# Patient Record
Sex: Female | Born: 1980 | Race: White | Hispanic: No | Marital: Married | Smoking: Current every day smoker
Health system: Southern US, Community
[De-identification: ages and names within clinical notes are randomized; demographics above are authoritative.]

## PROBLEM LIST (undated history)

## (undated) DIAGNOSIS — D649 Anemia, unspecified: Secondary | ICD-10-CM

## (undated) DIAGNOSIS — C801 Malignant (primary) neoplasm, unspecified: Secondary | ICD-10-CM

## (undated) DIAGNOSIS — K589 Irritable bowel syndrome without diarrhea: Secondary | ICD-10-CM

## (undated) HISTORY — PX: BREAST EXCISIONAL BIOPSY: SUR124

## (undated) HISTORY — PX: WISDOM TOOTH EXTRACTION: SHX21

## (undated) HISTORY — PX: BREAST SURGERY: SHX581

## (undated) HISTORY — PX: NOSE SURGERY: SHX723

## (undated) HISTORY — PX: TUBAL LIGATION: SHX77

## (undated) HISTORY — PX: CHOLECYSTECTOMY: SHX55

## (undated) HISTORY — PX: NASAL FRACTURE SURGERY: SHX718

---
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
## 1999-06-05 ENCOUNTER — Encounter: Payer: Self-pay | Admitting: Emergency Medicine

## 1999-06-05 ENCOUNTER — Inpatient Hospital Stay (HOSPITAL_COMMUNITY): Admission: EM | Admit: 1999-06-05 | Discharge: 1999-06-07 | Payer: Self-pay

## 2000-09-16 ENCOUNTER — Emergency Department (HOSPITAL_COMMUNITY): Admission: EM | Admit: 2000-09-16 | Discharge: 2000-09-17 | Payer: Self-pay | Admitting: Emergency Medicine

## 2001-05-19 ENCOUNTER — Emergency Department (HOSPITAL_COMMUNITY): Admission: EM | Admit: 2001-05-19 | Discharge: 2001-05-19 | Payer: Self-pay | Admitting: Emergency Medicine

## 2001-08-03 ENCOUNTER — Emergency Department (HOSPITAL_COMMUNITY): Admission: EM | Admit: 2001-08-03 | Discharge: 2001-08-03 | Payer: Self-pay | Admitting: Emergency Medicine

## 2002-02-14 ENCOUNTER — Encounter: Payer: Self-pay | Admitting: Obstetrics and Gynecology

## 2002-02-14 ENCOUNTER — Ambulatory Visit (HOSPITAL_COMMUNITY): Admission: RE | Admit: 2002-02-14 | Discharge: 2002-02-14 | Payer: Self-pay | Admitting: Obstetrics and Gynecology

## 2002-02-28 ENCOUNTER — Other Ambulatory Visit: Admission: RE | Admit: 2002-02-28 | Discharge: 2002-02-28 | Payer: Self-pay | Admitting: Obstetrics and Gynecology

## 2002-04-20 ENCOUNTER — Ambulatory Visit (HOSPITAL_COMMUNITY): Admission: RE | Admit: 2002-04-20 | Discharge: 2002-04-20 | Payer: Self-pay | Admitting: Obstetrics and Gynecology

## 2002-04-20 ENCOUNTER — Encounter: Payer: Self-pay | Admitting: Obstetrics and Gynecology

## 2002-05-08 ENCOUNTER — Inpatient Hospital Stay (HOSPITAL_COMMUNITY): Admission: AD | Admit: 2002-05-08 | Discharge: 2002-05-08 | Payer: Self-pay | Admitting: Internal Medicine

## 2002-05-30 ENCOUNTER — Emergency Department (HOSPITAL_COMMUNITY): Admission: EM | Admit: 2002-05-30 | Discharge: 2002-05-30 | Payer: Self-pay | Admitting: Emergency Medicine

## 2002-09-11 ENCOUNTER — Inpatient Hospital Stay (HOSPITAL_COMMUNITY): Admission: AD | Admit: 2002-09-11 | Discharge: 2002-09-14 | Payer: Self-pay | Admitting: Obstetrics and Gynecology

## 2003-11-11 ENCOUNTER — Emergency Department (HOSPITAL_COMMUNITY): Admission: EM | Admit: 2003-11-11 | Discharge: 2003-11-11 | Payer: Self-pay | Admitting: Emergency Medicine

## 2004-02-17 ENCOUNTER — Emergency Department (HOSPITAL_COMMUNITY): Admission: EM | Admit: 2004-02-17 | Discharge: 2004-02-17 | Payer: Self-pay | Admitting: Emergency Medicine

## 2004-09-25 ENCOUNTER — Ambulatory Visit (HOSPITAL_COMMUNITY): Admission: RE | Admit: 2004-09-25 | Discharge: 2004-09-25 | Payer: Self-pay | Admitting: Family Medicine

## 2005-10-20 ENCOUNTER — Ambulatory Visit (HOSPITAL_COMMUNITY): Admission: RE | Admit: 2005-10-20 | Discharge: 2005-10-20 | Payer: Self-pay | Admitting: Gastroenterology

## 2005-11-02 ENCOUNTER — Emergency Department (HOSPITAL_COMMUNITY): Admission: EM | Admit: 2005-11-02 | Discharge: 2005-11-02 | Payer: Self-pay | Admitting: Family Medicine

## 2006-07-26 ENCOUNTER — Emergency Department (HOSPITAL_COMMUNITY): Admission: EM | Admit: 2006-07-26 | Discharge: 2006-07-26 | Payer: Self-pay | Admitting: Family Medicine

## 2006-11-19 ENCOUNTER — Emergency Department (HOSPITAL_COMMUNITY): Admission: EM | Admit: 2006-11-19 | Discharge: 2006-11-19 | Payer: Self-pay | Admitting: Family Medicine

## 2007-02-10 ENCOUNTER — Inpatient Hospital Stay (HOSPITAL_COMMUNITY): Admission: AD | Admit: 2007-02-10 | Discharge: 2007-02-10 | Payer: Self-pay | Admitting: Obstetrics and Gynecology

## 2007-02-22 ENCOUNTER — Emergency Department (HOSPITAL_COMMUNITY): Admission: EM | Admit: 2007-02-22 | Discharge: 2007-02-22 | Payer: Self-pay | Admitting: Emergency Medicine

## 2007-08-09 ENCOUNTER — Inpatient Hospital Stay (HOSPITAL_COMMUNITY): Admission: AD | Admit: 2007-08-09 | Discharge: 2007-08-09 | Payer: Self-pay | Admitting: Obstetrics and Gynecology

## 2007-08-16 ENCOUNTER — Inpatient Hospital Stay (HOSPITAL_COMMUNITY): Admission: AD | Admit: 2007-08-16 | Discharge: 2007-08-16 | Payer: Self-pay | Admitting: Obstetrics and Gynecology

## 2007-09-01 ENCOUNTER — Inpatient Hospital Stay (HOSPITAL_COMMUNITY): Admission: RE | Admit: 2007-09-01 | Discharge: 2007-09-03 | Payer: Self-pay | Admitting: Obstetrics and Gynecology

## 2008-04-11 ENCOUNTER — Emergency Department (HOSPITAL_COMMUNITY): Admission: EM | Admit: 2008-04-11 | Discharge: 2008-04-11 | Payer: Self-pay | Admitting: Emergency Medicine

## 2009-04-28 ENCOUNTER — Inpatient Hospital Stay (HOSPITAL_COMMUNITY): Admission: AD | Admit: 2009-04-28 | Discharge: 2009-04-29 | Payer: Self-pay | Admitting: Family Medicine

## 2009-04-29 ENCOUNTER — Inpatient Hospital Stay (HOSPITAL_COMMUNITY): Admission: AD | Admit: 2009-04-29 | Discharge: 2009-04-29 | Payer: Self-pay | Admitting: Obstetrics & Gynecology

## 2009-05-02 ENCOUNTER — Inpatient Hospital Stay (HOSPITAL_COMMUNITY): Admission: AD | Admit: 2009-05-02 | Discharge: 2009-05-02 | Payer: Self-pay | Admitting: Obstetrics & Gynecology

## 2009-05-08 ENCOUNTER — Ambulatory Visit (HOSPITAL_COMMUNITY): Admission: RE | Admit: 2009-05-08 | Discharge: 2009-05-08 | Payer: Self-pay | Admitting: Obstetrics & Gynecology

## 2009-05-09 ENCOUNTER — Emergency Department (HOSPITAL_COMMUNITY): Admission: EM | Admit: 2009-05-09 | Discharge: 2009-05-09 | Payer: Self-pay | Admitting: Emergency Medicine

## 2009-12-18 ENCOUNTER — Other Ambulatory Visit: Payer: Self-pay | Admitting: Obstetrics and Gynecology

## 2009-12-19 ENCOUNTER — Encounter (INDEPENDENT_AMBULATORY_CARE_PROVIDER_SITE_OTHER): Payer: Self-pay | Admitting: Obstetrics and Gynecology

## 2009-12-19 ENCOUNTER — Inpatient Hospital Stay (HOSPITAL_COMMUNITY): Admission: AD | Admit: 2009-12-19 | Discharge: 2009-12-22 | Payer: Self-pay | Admitting: Obstetrics and Gynecology

## 2010-08-22 LAB — CBC
HCT: 23.5 % — ABNORMAL LOW (ref 36.0–46.0)
Hemoglobin: 7.7 g/dL — ABNORMAL LOW (ref 12.0–15.0)
MCH: 26.1 pg (ref 26.0–34.0)
MCHC: 32.8 g/dL (ref 30.0–36.0)
MCHC: 33.2 g/dL (ref 30.0–36.0)
MCV: 79.4 fL (ref 78.0–100.0)
Platelets: 104 10*3/uL — ABNORMAL LOW (ref 150–400)
RBC: 2.86 MIL/uL — ABNORMAL LOW (ref 3.87–5.11)
RBC: 2.96 MIL/uL — ABNORMAL LOW (ref 3.87–5.11)
RDW: 16.5 % — ABNORMAL HIGH (ref 11.5–15.5)
WBC: 10.1 10*3/uL (ref 4.0–10.5)
WBC: 9.5 10*3/uL (ref 4.0–10.5)

## 2010-08-23 LAB — RPR: RPR Ser Ql: NONREACTIVE

## 2010-08-23 LAB — CBC
MCH: 25.9 pg — ABNORMAL LOW (ref 26.0–34.0)
MCV: 79 fL (ref 78.0–100.0)
Platelets: 127 10*3/uL — ABNORMAL LOW (ref 150–400)
RBC: 4.14 MIL/uL (ref 3.87–5.11)

## 2010-09-08 LAB — GC/CHLAMYDIA PROBE AMP, GENITAL
Chlamydia, DNA Probe: NEGATIVE
GC Probe Amp, Genital: NEGATIVE

## 2010-09-08 LAB — WET PREP, GENITAL: Yeast Wet Prep HPF POC: NONE SEEN

## 2010-09-09 LAB — CBC
HCT: 37.8 % (ref 36.0–46.0)
Hemoglobin: 13.1 g/dL (ref 12.0–15.0)
MCHC: 34.6 g/dL (ref 30.0–36.0)
MCV: 88.7 fL (ref 78.0–100.0)
Platelets: 143 10*3/uL — ABNORMAL LOW (ref 150–400)
RBC: 4.36 MIL/uL (ref 3.87–5.11)
WBC: 10.2 10*3/uL (ref 4.0–10.5)
WBC: 12 10*3/uL — ABNORMAL HIGH (ref 4.0–10.5)

## 2010-09-09 LAB — GC/CHLAMYDIA PROBE AMP, GENITAL
Chlamydia, DNA Probe: NEGATIVE
GC Probe Amp, Genital: NEGATIVE

## 2010-09-09 LAB — ABO/RH: ABO/RH(D): O POS

## 2010-09-09 LAB — URINALYSIS, ROUTINE W REFLEX MICROSCOPIC
Glucose, UA: NEGATIVE mg/dL
Ketones, ur: NEGATIVE mg/dL
Leukocytes, UA: NEGATIVE
Specific Gravity, Urine: >1.03 — ABNORMAL HIGH (ref 1.005–1.030)
pH: 5.5 (ref 5.0–8.0)

## 2010-09-09 LAB — WET PREP, GENITAL
Trich, Wet Prep: NONE SEEN
Yeast Wet Prep HPF POC: NONE SEEN

## 2010-09-09 LAB — HCG, QUANTITATIVE, PREGNANCY
hCG, Beta Chain, Quant, S: 22430 m[IU]/mL — ABNORMAL HIGH (ref <5)
hCG, Beta Chain, Quant, S: 28197 m[IU]/mL — ABNORMAL HIGH (ref <5)
hCG, Beta Chain, Quant, S: 50029 m[IU]/mL — ABNORMAL HIGH (ref <5)

## 2010-10-20 NOTE — Discharge Summary (Signed)
NAME:  Tammy Buckley, Tammy Buckley NO.:  [id number]   MEDICAL RECORD NO.:  [id number]          PATIENT TYPE:  INP   LOCATION:  9104                          FACILITY:  [facility]   PHYSICIAN:  Malachi Pro. Ambrose Mantle, M.D. DATE OF BIRTH:  [birth year]   DATE OF ADMISSION:  09/01/2007  DATE OF DISCHARGE:  09/03/2007                               DISCHARGE SUMMARY   HISTORY:  A 30 year old, white separated female, para 2-0-1-2, gravida  4, EDC on September 06, 2007, admitted for induction.  Blood group and type O  positive, negative antibody, nonreactive serology, rubella immune,  hepatitis B surface antigen negative, HIV negative, GC and Chlamydia  negative, first trimester screen negative, AFP negative, 1-hour Glucola  100, group B strep negative.  Vaginal ultrasound on February 02, 2007,  crown-rump length 2.5 cm, 9 weeks 1 day, EDC on September 06, 2007.  Ultrasound on April 17, 2007, average gestational age [redacted] weeks 5  days, [facility] on September 06, 2007.  Prenatal care was uncomplicated.  The  patient had had contractions since approximately at 10:30 p.m. on August 31, 2007.  She was admitted for induction.   PAST MEDICAL HISTORY:  No known allergies.  No operations.  Illnesses:  Possible GERD, migraines, anxiety and depression, and urinary tract  infections.   SOCIAL HISTORY:  Alcohol, tobacco, and drugs none.   FAMILY HISTORY:  Multiple diabetes and high blood pressure.  Maternal  grandfather with heart disease, high blood pressure.  Paternal  grandfather with emphysema.  Brother and sister with depression.   OBSTETRICS HISTORY:  In May 2000, a 7-pound 7-ounce female vaginally.  In 2001, early abortion.  In April 2004, 7-pound 13-ounce female  vaginally.   PHYSICAL EXAMINATION:  VITAL SIGNS:  On admission, her vital signs were  normal.  HEART AND LUNGS:  Normal.  ABDOMEN:  Soft.  Fundal height had been 38 cm on August 24, 2007.  Fetal  heart tones were normal.  Contractions every 2-3 minutes.   Cervix 2-3  cm, 40% vertex at -3.  Artificial rupture of membranes produced clear  fluid.  The patient reached full dilatation after an epidural, pushed  well and delivered spontaneously, OA of her second-degree midline  laceration by Dr. Ambrose Mantle, a living female infant 7 pounds 14 ounces.  Apgars of 8 at 1 and 9 at 5 minutes.  Placenta was intact.  The uterus  was normal, second-degree midline laceration repaired with 3-0 Vicryl  under local block.  Blood loss was about 400 mL.   The patient was seen by social work during the hospitalization.  They  gave psychosocial support and ongoing assessments of needs as needed and  gave information and referral to community resources.  On postpartum,  the patient did well and was discharged on the second postpartum day.  RPR was nonreactive, initial hemoglobin 12.1, hematocrit 35.4, white  count 9800, platelet count 126,000, followup hemoglobin was 12.   FINAL DIAGNOSES:  Intrauterine pregnancy at 39 plus weeks, delivered  occipitoanterior.   OPERATION:  Spontaneous delivery, OA.  Repair of second-degree midline  laceration.  Final condition, improved.  INSTRUCTIONS:  Include a regular discharge instruction booklet.  Percocet 5/325, 24 tablets, 1 every 4-6 hours as needed for pain.  The  patient is advised to return in 6 weeks for followup examination.      Malachi Pro. Ambrose Mantle, M.D.  Electronically Signed     TFH/MEDQ  D:  09/03/2007  T:  09/04/2007  Job:  [id number]
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
## 2011-02-09 ENCOUNTER — Emergency Department: Payer: Self-pay | Admitting: *Deleted

## 2011-02-26 ENCOUNTER — Ambulatory Visit: Payer: Self-pay | Admitting: Surgery

## 2011-03-01 LAB — CBC
HCT: 35.9 — ABNORMAL LOW
Hemoglobin: 12
Hemoglobin: 12.1
MCHC: 34.1
MCV: 83.9
RBC: 4.24
RBC: 4.27
WBC: 10.1
WBC: 9.8

## 2011-03-18 LAB — CBC
HCT: 34.5 — ABNORMAL LOW
Hemoglobin: 11.8 — ABNORMAL LOW
MCV: 87.9
Platelets: 161
RDW: 13.7

## 2011-03-18 LAB — DIFFERENTIAL
Basophils Absolute: 0
Eosinophils Absolute: 0
Eosinophils Relative: 0
Monocytes Absolute: 0.5

## 2011-03-18 LAB — BASIC METABOLIC PANEL
BUN: 10
CO2: 25
Chloride: 104
Glucose, Bld: 111 — ABNORMAL HIGH
Potassium: 3.7

## 2011-03-19 LAB — URINALYSIS, ROUTINE W REFLEX MICROSCOPIC
Bilirubin Urine: NEGATIVE
Glucose, UA: NEGATIVE
Ketones, ur: NEGATIVE
Nitrite: NEGATIVE
Protein, ur: NEGATIVE

## 2011-12-24 ENCOUNTER — Emergency Department (HOSPITAL_COMMUNITY)
Admission: EM | Admit: 2011-12-24 | Discharge: 2011-12-24 | Disposition: A | Discharge status: Home / Self Care | Payer: Self-pay | Attending: Emergency Medicine | Admitting: Emergency Medicine

## 2011-12-24 ENCOUNTER — Encounter (HOSPITAL_COMMUNITY): Payer: Self-pay | Admitting: *Deleted

## 2011-12-24 DIAGNOSIS — K589 Irritable bowel syndrome without diarrhea: Secondary | ICD-10-CM | POA: Insufficient documentation

## 2011-12-24 DIAGNOSIS — Z9089 Acquired absence of other organs: Secondary | ICD-10-CM | POA: Insufficient documentation

## 2011-12-24 DIAGNOSIS — A499 Bacterial infection, unspecified: Secondary | ICD-10-CM | POA: Insufficient documentation

## 2011-12-24 DIAGNOSIS — F17200 Nicotine dependence, unspecified, uncomplicated: Secondary | ICD-10-CM | POA: Insufficient documentation

## 2011-12-24 DIAGNOSIS — B9689 Other specified bacterial agents as the cause of diseases classified elsewhere: Secondary | ICD-10-CM | POA: Insufficient documentation

## 2011-12-24 DIAGNOSIS — N760 Acute vaginitis: Secondary | ICD-10-CM | POA: Insufficient documentation

## 2011-12-24 HISTORY — DX: Irritable bowel syndrome, unspecified: K58.9

## 2011-12-24 LAB — CBC WITH DIFFERENTIAL/PLATELET
Basophils Absolute: 0 10*3/uL (ref 0.0–0.1)
Basophils Relative: 1 % (ref 0–1)
Eosinophils Absolute: 0.1 10*3/uL (ref 0.0–0.7)
Hemoglobin: 14.2 g/dL (ref 12.0–15.0)
MCH: 28.7 pg (ref 26.0–34.0)
MCHC: 32.9 g/dL (ref 30.0–36.0)
Neutro Abs: 5.2 10*3/uL (ref 1.7–7.7)
Neutrophils Relative %: 59 % (ref 43–77)
Platelets: 190 10*3/uL (ref 150–400)
RDW: 14.4 % (ref 11.5–15.5)

## 2011-12-24 LAB — BASIC METABOLIC PANEL
BUN: 14 mg/dL (ref 6–23)
Chloride: 101 mEq/L (ref 96–112)
GFR calc Af Amer: >90 mL/min (ref >90)
GFR calc non Af Amer: >90 mL/min (ref >90)
Potassium: 3.9 mEq/L (ref 3.5–5.1)
Sodium: 136 mEq/L (ref 135–145)

## 2011-12-24 LAB — URINALYSIS, ROUTINE W REFLEX MICROSCOPIC
Bilirubin Urine: NEGATIVE
Hgb urine dipstick: NEGATIVE
Ketones, ur: NEGATIVE mg/dL
Nitrite: NEGATIVE
pH: 7 (ref 5.0–8.0)

## 2011-12-24 LAB — WET PREP, GENITAL: Trich, Wet Prep: NONE SEEN

## 2011-12-24 LAB — HCG, QUANTITATIVE, PREGNANCY: hCG, Beta Chain, Quant, S: <1 m[IU]/mL (ref <5)

## 2011-12-24 MED ORDER — METRONIDAZOLE 500 MG PO TABS: 500.0000 mg | ORAL_TABLET | Freq: Two times a day (BID) | ORAL | Status: AC

## 2011-12-24 NOTE — ED Notes (Signed)
Pt reports she informed she had precancer cells in her cervix when she was pregnant with her first daughter 14 years ago. She was told if she gave birth naturally these cells would be scraped off and it was confirmed that it was scraped off after the biopsy. Pt reports an ovarian cyst in 3rd pregnancy. She states all of her pregnancies occurred despite being on birth control and had her tubes tied after fourth kid.

## 2011-12-24 NOTE — ED Provider Notes (Signed)
History     CSN: [id number]  Arrival date & time 12/24/11  1737   First MD Initiated Contact with Patient 12/24/11 2007      Chief Complaint  Patient presents with  . Flank Pain  . Vaginal Bleeding   HPI  History provided by the patient. Patient is a 31 year old female with history of IBS, cholecystectomy, tubal ligation and cesarean section who presents with complaints of left-sided abdominal pains as well as complaints of irregular vaginal bleeding and spotting. Patient reports having normal menstrual period earlier this month began having spotting over the last few days. Symptoms were associated with lower and left-sided abdominal pains pain is described as mild to moderate as aching and waxing and waning pain. Patient has not taken any medications for her symptoms. Pain seems worse with movements and better at rest lying still. She denies any associated fever, chills, sweats, vomiting, diarrhea or constipation.    Past Medical History  Diagnosis Date  . IBS (irritable bowel syndrome)     Past Surgical History  Procedure Date  . Cholecystectomy   . Tubal ligation   . Nose surgery   . Cesarean section     History reviewed. No pertinent family history.  History  Substance Use Topics  . Smoking status: Current Everyday Smoker    Types: Cigarettes  . Smokeless tobacco: Not on file  . Alcohol Use: Yes     rare    OB History    Grav Para Term Preterm Abortions TAB SAB Ect Mult Living                  Review of Systems  Constitutional: Negative for fever and chills.  Respiratory: Negative for shortness of breath.   Cardiovascular: Negative for chest pain.  Gastrointestinal: Positive for nausea and abdominal pain. Negative for vomiting, diarrhea and constipation.  Genitourinary: Positive for vaginal bleeding and vaginal discharge. Negative for dysuria, hematuria and flank pain.    Allergies  Oxycodone  Home Medications  No current outpatient prescriptions on  file.  BP 97/50  Pulse 87  Temp 98.3 F (36.8 C) (Oral)  Resp 16  Ht 5\' 6"  (1.676 m)  Wt 158 lb (71.668 kg)  BMI 25.50 kg/m2  SpO2 100%  LMP 12/07/2011  Physical Exam  Nursing note and vitals reviewed. Constitutional: She is oriented to person, place, and time. She appears well-developed and well-nourished. No distress.  HENT:  Head: Normocephalic.  Cardiovascular: Normal rate and regular rhythm.   Pulmonary/Chest: Effort normal and breath sounds normal. No respiratory distress. She has no wheezes.  Abdominal: Soft. There is tenderness in the left upper quadrant and left lower quadrant. There is no rebound, no guarding, no CVA tenderness and negative Murphy's sign.  Genitourinary: Uterus is not enlarged and not tender. Cervix exhibits discharge. Cervix exhibits no motion tenderness and no friability. Right adnexum displays no mass, no tenderness and no fullness. Left adnexum displays tenderness. Left adnexum displays no mass and no fullness.       Chaperone was present. Mild reproducible left adnexal tenderness.  Neurological: She is alert and oriented to person, place, and time.  Skin: Skin is warm and dry. No rash noted.  Psychiatric: She has a normal mood and affect. Her behavior is normal.    ED Course  Procedures  Results for orders placed during the hospital encounter of 12/24/11  URINALYSIS, ROUTINE W REFLEX MICROSCOPIC      Component Value Range   Color, Urine YELLOW  YELLOW  APPearance CLEAR  CLEAR   Specific Gravity, Urine 1.016  1.005 - 1.030   pH 7.0  5.0 - 8.0   Glucose, UA NEGATIVE  NEGATIVE mg/dL   Hgb urine dipstick NEGATIVE  NEGATIVE   Bilirubin Urine NEGATIVE  NEGATIVE   Ketones, ur NEGATIVE  NEGATIVE mg/dL   Protein, ur NEGATIVE  NEGATIVE mg/dL   Urobilinogen, UA 0.2  0.0 - 1.0 mg/dL   Nitrite NEGATIVE  NEGATIVE   Leukocytes, UA NEGATIVE  NEGATIVE  POCT PREGNANCY, URINE      Component Value Range   Preg Test, Ur NEGATIVE  NEGATIVE  WET PREP,  GENITAL      Component Value Range   Yeast Wet Prep HPF POC NONE SEEN  NONE SEEN   Trich, Wet Prep NONE SEEN  NONE SEEN   Clue Cells Wet Prep HPF POC MODERATE (*) NONE SEEN   WBC, Wet Prep HPF POC MODERATE (*) NONE SEEN  CBC WITH DIFFERENTIAL      Component Value Range   WBC 8.8  4.0 - 10.5 K/uL   RBC 4.95  3.87 - 5.11 MIL/uL   Hemoglobin 14.2  12.0 - 15.0 g/dL   HCT 40.9  81.1 - 91.4 %   MCV 87.3  78.0 - 100.0 fL   MCH 28.7  26.0 - 34.0 pg   MCHC 32.9  30.0 - 36.0 g/dL   RDW 78.2  95.6 - 21.3 %   Platelets 190  150 - 400 K/uL   Neutrophils Relative 59  43 - 77 %   Neutro Abs 5.2  1.7 - 7.7 K/uL   Lymphocytes Relative 33  12 - 46 %   Lymphs Abs 2.9  0.7 - 4.0 K/uL   Monocytes Relative 6  3 - 12 %   Monocytes Absolute 0.6  0.1 - 1.0 K/uL   Eosinophils Relative 1  0 - 5 %   Eosinophils Absolute 0.1  0.0 - 0.7 K/uL   Basophils Relative 1  0 - 1 %   Basophils Absolute 0.0  0.0 - 0.1 K/uL  BASIC METABOLIC PANEL      Component Value Range   Sodium 136  135 - 145 mEq/L   Potassium 3.9  3.5 - 5.1 mEq/L   Chloride 101  96 - 112 mEq/L   CO2 25  19 - 32 mEq/L   Glucose, Bld 95  70 - 99 mg/dL   BUN 14  6 - 23 mg/dL   Creatinine, Ser 0.86  0.50 - 1.10 mg/dL   Calcium 9.6  8.4 - 57.8 mg/dL   GFR calc non Af Amer >90  >90 mL/min   GFR calc Af Amer >90  >90 mL/min  HCG, QUANTITATIVE, PREGNANCY      Component Value Range   hCG, Beta Chain, Quant, S <1  <5 mIU/mL       1. Bacterial vaginosis       MDM  8:25 PM patient seen and evaluated. Patient with left abdominal pain and discomfort and reports of irregular vaginal bleeding and spotting. Urine unremarkable. Will perform pelvic for evaluation of possible infection basic lab testing.        Angus Seller, [place] 12/25/11 [phone]

## 2011-12-24 NOTE — ED Notes (Signed)
Pt c/o left flank pain that radiates down to groin, that began last night, states pain has worsened since last night. States also has abnormal vaginal spotting. Pt has had normal period for this month and reports started spotting on yesterday. States "I've had my tubes done."

## 2011-12-25 LAB — GC/CHLAMYDIA PROBE AMP, GENITAL
Chlamydia, DNA Probe: NEGATIVE
GC Probe Amp, Genital: NEGATIVE

## 2011-12-26 NOTE — ED Provider Notes (Signed)
Medical screening examination/treatment/procedure(s) were performed by non-physician practitioner and as supervising physician I was immediately available for consultation/collaboration.   Ankit Degregorio, MD 12/26/11 0011 

## 2013-06-07 HISTORY — PX: BREAST EXCISIONAL BIOPSY: SUR124

## 2013-06-27 ENCOUNTER — Ambulatory Visit: Payer: Self-pay

## 2013-06-28 ENCOUNTER — Ambulatory Visit: Payer: Self-pay

## 2013-06-29 ENCOUNTER — Ambulatory Visit: Payer: Self-pay

## 2013-07-03 LAB — PATHOLOGY REPORT

## 2013-07-05 ENCOUNTER — Ambulatory Visit (INDEPENDENT_AMBULATORY_CARE_PROVIDER_SITE_OTHER): Payer: PRIVATE HEALTH INSURANCE | Admitting: General Surgery

## 2013-07-05 ENCOUNTER — Ambulatory Visit: Payer: Self-pay | Admitting: General Surgery

## 2013-07-05 ENCOUNTER — Encounter: Payer: Self-pay | Admitting: General Surgery

## 2013-07-05 VITALS — BP 120/76 | HR 72 | Resp 12 | Ht 65.0 in | Wt 172.0 lb

## 2013-07-05 DIAGNOSIS — N63 Unspecified lump in unspecified breast: Secondary | ICD-10-CM

## 2013-07-05 NOTE — Patient Instructions (Addendum)
Patient to be scheduled for right breast surgery. Patient advised to continue self breast checks. She is also advised to call our office with any questions or concerns.   Patient's surgery has been scheduled for 07-10-13 at [facility].

## 2013-07-05 NOTE — Progress Notes (Signed)
Patient ID: Tammy Buckley, female   DOB: 06-13-1980, 33 y.o.   MRN: [id number]  Chief Complaint  Patient presents with  . Other    right breast mass     HPI Tammy Buckley is a 33 y.o. female who presents for a breast evaluation. The most recent mammogram and ultrasound was done on 06/28/13. Patient had an right breast biopsy on 06/29/13.  Patient does not perform regular self breast checks. The patient states this is her first mammogram done.  The patient felt a lump in the right breast three weeks ago.   HPI  Past Medical History  Diagnosis Date  . IBS (irritable bowel syndrome)     Past Surgical History  Procedure Laterality Date  . Cholecystectomy    . Tubal ligation    . Nose surgery    . Cesarean section    . Wisdom tooth extraction      History reviewed. No pertinent family history.  Social History History  Substance Use Topics  . Smoking status: Former Smoker    Types: Cigarettes  . Smokeless tobacco: Never Used  . Alcohol Use: Yes     Comment: rare    Allergies  Allergen Reactions  . Oxycodone Nausea And Vomiting    No current outpatient prescriptions on file.   No current facility-administered medications for this visit.    Review of Systems Review of Systems  Constitutional: Negative.   Respiratory: Negative.   Cardiovascular: Negative.     Blood pressure 120/76, pulse 72, resp. rate 12, height 5\' 5"  (1.651 m), weight 172 lb (78.019 kg), last menstrual period 06/27/2013.  Physical Exam Physical Exam  Constitutional: She is oriented to person, place, and time. She appears well-developed and well-nourished.  Eyes: Conjunctivae are normal.  Neck: Neck supple. No thyromegaly present.  Cardiovascular: Normal rate, regular rhythm and normal heart sounds.   Pulmonary/Chest: Effort normal and breath sounds normal. Right breast exhibits mass (9 o'clock ill defined firm 2cm mass. There is some eccymosis of skin  from recent biopsy .). Right breast  exhibits no inverted nipple, no nipple discharge, no skin change and no tenderness. Left breast exhibits no inverted nipple, no mass, no nipple discharge, no skin change and no tenderness.  Lymphadenopathy:    She has no cervical adenopathy.    She has no axillary adenopathy.  Neurological: She is alert and oriented to person, place, and time.  Skin: Skin is warm and dry.    Data Reviewed Mammogram and ultrasound reviewed , path report.  Assessment    Mammogram and ultrasound findings showed   a highly suspicious mass in the right breast. However, pathology from core biopsy shows benign changes. This is discordant and therefore excision is recommended. Discussed fully with patient and she is agreeable.     Plan          Patient's surgery has been scheduled for 07-10-13 at [facility].   Tammy Buckley 07/05/2013, 1:37 PM

## 2013-07-10 ENCOUNTER — Ambulatory Visit: Payer: Self-pay | Admitting: General Surgery

## 2013-07-10 DIAGNOSIS — N63 Unspecified lump in unspecified breast: Secondary | ICD-10-CM

## 2013-07-12 ENCOUNTER — Encounter: Payer: Self-pay | Admitting: General Surgery

## 2013-07-12 LAB — PATHOLOGY REPORT

## 2013-07-16 ENCOUNTER — Encounter: Payer: Self-pay | Admitting: General Surgery

## 2013-07-19 ENCOUNTER — Ambulatory Visit (INDEPENDENT_AMBULATORY_CARE_PROVIDER_SITE_OTHER): Payer: Self-pay | Admitting: General Surgery

## 2013-07-19 VITALS — BP 120/74 | HR 72 | Resp 12 | Ht 65.0 in | Wt 178.0 lb

## 2013-07-19 DIAGNOSIS — D249 Benign neoplasm of unspecified breast: Secondary | ICD-10-CM

## 2013-07-19 NOTE — Progress Notes (Signed)
This is a 33 year old female here today for her post op right breast lumpectomy done on 07/10/13. Patient states she is doing well.  Right breast incision looks clean and healing well. Mild swelling at the site without any signs of infection. 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 Also a fibroadenoma.   Patient to return in three months.

## 2013-07-19 NOTE — Patient Instructions (Signed)
Follow up in 3 months. Continue monthly self breast exams.

## 2013-07-20 ENCOUNTER — Encounter: Payer: Self-pay | Admitting: General Surgery

## 2013-07-20 DIAGNOSIS — D249 Benign neoplasm of unspecified breast: Secondary | ICD-10-CM | POA: Insufficient documentation

## 2013-10-16 ENCOUNTER — Ambulatory Visit: Payer: PRIVATE HEALTH INSURANCE | Admitting: General Surgery

## 2013-11-01 ENCOUNTER — Encounter: Payer: Self-pay | Admitting: *Deleted

## 2013-12-05 ENCOUNTER — Ambulatory Visit (INDEPENDENT_AMBULATORY_CARE_PROVIDER_SITE_OTHER): Payer: PRIVATE HEALTH INSURANCE | Admitting: General Surgery

## 2013-12-05 ENCOUNTER — Encounter: Payer: Self-pay | Admitting: General Surgery

## 2013-12-05 VITALS — BP 124/72 | HR 80 | Resp 14 | Ht 65.0 in | Wt 177.0 lb

## 2013-12-05 DIAGNOSIS — D249 Benign neoplasm of unspecified breast: Secondary | ICD-10-CM

## 2013-12-05 DIAGNOSIS — D241 Benign neoplasm of right breast: Secondary | ICD-10-CM

## 2013-12-05 NOTE — Progress Notes (Signed)
Patient ID: Tammy Buckley, female   DOB: 1980-07-18, 33 y.o.   MRN: [id number]  Chief Complaint  Patient presents with  . Follow-up    right breast mass     HPI Tammy Buckley is a 33 y.o. female here today for her three monthfollow up right breast lumpectomy done on 07/10/13. Patient states she is doing well.   HPI  Past Medical History  Diagnosis Date  . IBS (irritable bowel syndrome)     Past Surgical History  Procedure Laterality Date  . Cholecystectomy    . Tubal ligation    . Nose surgery    . Cesarean section    . Wisdom tooth extraction      History reviewed. No pertinent family history.  Social History History  Substance Use Topics  . Smoking status: Former Smoker    Types: Cigarettes  . Smokeless tobacco: Never Used  . Alcohol Use: Yes     Comment: rare    Allergies  Allergen Reactions  . Oxycodone Nausea And Vomiting    No current outpatient prescriptions on file.   No current facility-administered medications for this visit.    Review of Systems Review of Systems  Constitutional: Negative.   Respiratory: Negative.   Cardiovascular: Negative.     Blood pressure 124/72, pulse 80, resp. rate 14, height 5\' 5"  (1.651 m), weight 177 lb (80.287 kg).  Physical Exam Physical Exam  Constitutional: She is oriented to person, place, and time. She appears well-developed and well-nourished.  Neck: Neck supple. No mass and no thyromegaly present.  Pulmonary/Chest: Right breast exhibits no inverted nipple, no mass, no nipple discharge, no skin change and no tenderness. Left breast exhibits no inverted nipple, no mass, no nipple discharge, no skin change and no tenderness. Breasts are symmetrical.  Right breast incision site looks clean and healing well.   Lymphadenopathy:    She has no cervical adenopathy.    She has no axillary adenopathy.  Neurological: She is alert and oriented to person, place, and time.  Skin: Skin is warm and dry.    Data  Reviewed Prior notes  Assessment    Stable exam. Pt had excision of a complex sclerosing lesion and fibroadenoma, no atypia from right breast.     Plan    No ongoing issues. Advised on self exam and yearly f/u with PCP        [facility] G 12/06/2013, 7:21 AM

## 2013-12-05 NOTE — Patient Instructions (Addendum)
Patient to return as needed. Continue self breast exams. Call office for any new breast issues or concerns. Follow with [facility] or PCP for yearly checks.

## 2013-12-06 ENCOUNTER — Encounter: Payer: Self-pay | Admitting: General Surgery

## 2014-04-08 ENCOUNTER — Encounter: Payer: Self-pay | Admitting: General Surgery

## 2014-09-28 NOTE — Op Note (Signed)
PATIENT NAME:  Tammy Buckley, Tammy Buckley MR#:  [id number] DATE OF BIRTH:  10-16-1980  DATE OF PROCEDURE:  07/10/2013  PREOPERATIVE DIAGNOSIS: Right breast mass.   POSTOPERATIVE DIAGNOSIS: Right breast mass.   OPERATION PERFORMED: Right breast mass excision with ultrasound-guided wire localization.   SURGEON: Mckinley Jewel, MD  ANESTHESIA: General.   COMPLICATIONS: None.   ESTIMATED BLOOD LOSS: Minimal.   DRAINS: None.  CLINICAL NOTE: This patient found a small lump in the right breast, in the outer aspect, and underwent mammography and ultrasound. There was evidence of a very hypoechoic mass which was highly suspicious and a core biopsy was performed. The core biopsy however showed only fibrocystic changes and this was felt to be discordant with the imaging findings. Therefore, excision was recommended. The patient was agreeable and was brought to the operating room for the same.   DESCRIPTION OF PROCEDURE: The patient was put to sleep in the supine position on the operating table. The right breast was prepped and draped out as a sterile field. Timeout procedure was performed. Ultrasound probe was brought up to the field, and the site of the clip that was placed in the biopsy site, in the area of mass, was identified and through the prior biopsy entrance site had a Bard ultra wire that was positioned going through this area. Following this a radial incision was mapped out. A local anesthetic of 0.5% Marcaine, 10 mL, was instilled. A skin incision was made from outside the areola towards the wire entrance site laterally between the 8 to 9 o'clock position. The skin and subcutaneous tissue was elevated on both sides, and with the wire as a guide a core of tissue was excised out and noted to contain a very hard mass within it. It appeared to encroach on the medial aspect of it and on evaluation of the lumpectomy cavity, at the medial end, a second nodule was identified, much smaller, about a centimeter in size  and fairly firm in consistency. This was then excised out and the medial portion of this was tagged as the medial margin. The main specimen was tagged for margins and subsequently sent to x-ray showing the presence of the clip and then to pathology for inking. Subsequent review of the specimen with the pathologist was performed to orient the additional tissue that was removed. After ensuring hemostasis, the deeper tissues were closed with 2-0 Vicryl, and the skin was closed with subcuticular 4-0 Vicryl, covered with Dermabond. The procedure was well tolerated. She was subsequently returned to the recovery room in stable condition.  ____________________________ S.Robinette Haines, MD sgs:sb D: 07/10/2013 13:59:26 ET T: 07/10/2013 14:28:42 ET JOB#: [id number]  cc: Synthia Innocent. Jamal Collin, MD, <Dictator> [facility] Robinette Haines MD ELECTRONICALLY SIGNED 07/10/2013 17:56

## 2015-03-17 ENCOUNTER — Encounter (HOSPITAL_COMMUNITY): Payer: Self-pay | Admitting: Emergency Medicine

## 2015-03-17 ENCOUNTER — Emergency Department (HOSPITAL_COMMUNITY)
Admission: EM | Admit: 2015-03-17 | Discharge: 2015-03-17 | Disposition: A | Discharge status: Home / Self Care | Payer: Self-pay | Attending: Emergency Medicine | Admitting: Emergency Medicine

## 2015-03-17 DIAGNOSIS — Z87891 Personal history of nicotine dependence: Secondary | ICD-10-CM | POA: Insufficient documentation

## 2015-03-17 DIAGNOSIS — Z8719 Personal history of other diseases of the digestive system: Secondary | ICD-10-CM | POA: Insufficient documentation

## 2015-03-17 DIAGNOSIS — Z791 Long term (current) use of non-steroidal anti-inflammatories (NSAID): Secondary | ICD-10-CM | POA: Insufficient documentation

## 2015-03-17 DIAGNOSIS — L02416 Cutaneous abscess of left lower limb: Secondary | ICD-10-CM | POA: Insufficient documentation

## 2015-03-17 DIAGNOSIS — L0291 Cutaneous abscess, unspecified: Secondary | ICD-10-CM

## 2015-03-17 MED ORDER — DOXYCYCLINE HYCLATE 100 MG PO TABS: 100.0000 mg | ORAL_TABLET | Freq: Two times a day (BID) | ORAL | Status: DC

## 2015-03-17 MED ORDER — HYDROCODONE-ACETAMINOPHEN 5-325 MG PO TABS: 1.0000 | ORAL_TABLET | ORAL | Status: DC | PRN

## 2015-03-17 MED ORDER — LIDOCAINE HCL (PF) 1 % IJ SOLN
2.0000 mL | Freq: Once | INTRAMUSCULAR | Status: AC
  Administered 2015-03-17: 2 mL
  Filled 2015-03-17: qty 5

## 2015-03-17 MED ORDER — DOXYCYCLINE HYCLATE 100 MG PO TABS
100.0000 mg | ORAL_TABLET | Freq: Once | ORAL | Status: AC
  Administered 2015-03-17: 100 mg via ORAL
  Filled 2015-03-17: qty 1

## 2015-03-17 NOTE — ED Notes (Signed)
Per pt, states boils on right thigh and posterior left thigh-

## 2015-03-17 NOTE — ED Provider Notes (Signed)
CSN: [id number]     Arrival date & time 03/17/15  1716 History   First MD Initiated Contact with Patient 03/17/15 2216     Chief Complaint  Patient presents with  . Abscess     (Consider location/radiation/quality/duration/timing/severity/associated sxs/prior Treatment) HPI Comments: Patient states she has an abscess on the posterior left thigh has been there for several days and is now started draining but it has become increasingly painful and is uncomfortable when she sits she also has had abscesses in the right anterior thigh that she's had for several days she's been doing warm compresses and this is actually improving  Patient is a 34 y.o. female presenting with abscess. The history is provided by the patient.  Abscess Location:  Leg Leg abscess location:  L leg Abscess quality: draining, painful and redness   Red streaking: no   Progression:  Worsening Chronicity:  New Context: not diabetes, not immunosuppression and not injected drug use   Associated symptoms: no fever     Past Medical History  Diagnosis Date  . IBS (irritable bowel syndrome)    Past Surgical History  Procedure Laterality Date  . Cholecystectomy    . Tubal ligation    . Nose surgery    . Cesarean section    . Wisdom tooth extraction     No family history on file. Social History  Substance Use Topics  . Smoking status: Former Smoker    Types: Cigarettes  . Smokeless tobacco: Never Used  . Alcohol Use: Yes     Comment: rare   OB History    Gravida Para Term Preterm AB TAB SAB Ectopic Multiple Living   4 3   1  1   3       Obstetric Comments   1st Menstrual Cycle: 12 1st Pregnancy:  17     Review of Systems  Constitutional: Negative for fever and chills.  Skin: Positive for wound.  All other systems reviewed and are negative.     Allergies  Oxycodone  Home Medications   Prior to Admission medications   Medication Sig Start Date End Date Taking? Authorizing Provider  naproxen  sodium (ANAPROX) 220 MG tablet Take 220 mg by mouth 2 (two) times daily with a meal.   Yes Historical Provider, MD  doxycycline (VIBRA-TABS) 100 MG tablet Take 1 tablet (100 mg total) by mouth 2 (two) times daily. 03/17/15   Junius Creamer, NP  HYDROcodone-acetaminophen (NORCO/VICODIN) 5-325 MG tablet Take 1 tablet by mouth every 4 (four) hours as needed for moderate pain. 03/17/15   Junius Creamer, NP   BP 121/72 mmHg  Pulse 86  Temp(Src) 98 F (36.7 C) (Oral)  Resp 16  SpO2 100% Physical Exam  Constitutional: She appears well-developed and well-nourished.  HENT:  Head: Normocephalic.  Eyes: Pupils are equal, round, and reactive to light.  Neck: Normal range of motion.  Cardiovascular: Normal rate.   Pulmonary/Chest: Effort normal.  Musculoskeletal: Normal range of motion. She exhibits tenderness. She exhibits no edema.  Neurological: She is alert.  Skin: There is erythema.       ED Course  .Marland KitchenIncision and Drainage Date/Time: 03/17/2015 11:05 PM Performed by: Junius Creamer Authorized by: Junius Creamer Consent: Verbal consent obtained. Written consent not obtained. Consent given by: patient Patient understanding: patient states understanding of the procedure being performed Patient identity confirmed: verbally with patient Type: abscess Body area: lower extremity Location details: left leg Anesthesia: local infiltration Local anesthetic: lidocaine 1% without epinephrine Anesthetic total: 2  ml Patient sedated: no Scalpel size: 11 Needle gauge: 22 Incision type: single straight Drainage: purulent Drainage amount: moderate Wound treatment: wound left open Patient tolerance: Patient tolerated the procedure well with no immediate complications   (including critical care time) Labs Review Labs Reviewed - No data to display  Imaging Review No results found. I have personally reviewed and evaluated these images and lab results as part of my medical decision-making.   EKG  Interpretation None     I&D larger abscess of the 4 that were visible to me that she's been started on antibiotic as a precaution due to the number of superficial abscesses MDM   Final diagnoses:  Abscess         Junius Creamer, NP 03/17/15 2308  Junius Creamer, NP 03/17/15 4388  Charlesetta Shanks, MD 03/20/15 1406

## 2015-03-17 NOTE — Discharge Instructions (Signed)
Incision and Drainage °Incision and drainage is a procedure in which a sac-like structure (cystic structure) is opened and drained. The area to be drained usually contains material such as pus, fluid, or blood.  °LET YOUR CAREGIVER KNOW ABOUT:  °· Allergies to medicine. °· Medicines taken, including vitamins, herbs, eyedrops, over-the-counter medicines, and creams. °· Use of steroids (by mouth or creams). °· Previous problems with anesthetics or numbing medicines. °· History of bleeding problems or blood clots. °· Previous surgery. °· Other health problems, including diabetes and kidney problems. °· Possibility of pregnancy, if this applies. °RISKS AND COMPLICATIONS °· Pain. °· Bleeding. °· Scarring. °· Infection. °BEFORE THE PROCEDURE  °You may need to have an ultrasound or other imaging tests to see how large or deep your cystic structure is. Blood tests may also be used to determine if you have an infection or how severe the infection is. You may need to have a tetanus shot. °PROCEDURE  °The affected area is cleaned with a cleaning fluid. The cyst area will then be numbed with a medicine (local anesthetic). A small incision will be made in the cystic structure. A syringe or catheter may be used to drain the contents of the cystic structure, or the contents may be squeezed out. The area will then be flushed with a cleansing solution. After cleansing the area, it is often gently packed with a gauze or another wound dressing. Once it is packed, it will be covered with gauze and tape or some other type of wound dressing.  °AFTER THE PROCEDURE  °· Often, you will be allowed to go home right after the procedure. °· You may be given antibiotic medicine to prevent or heal an infection. °· If the area was packed with gauze or some other wound dressing, you will likely need to come back in 1 to 2 days to get it removed. °· The area should heal in about 14 days. °  °This information is not intended to replace advice given  to you by your health care provider. Make sure you discuss any questions you have with your health care provider. °  °Document Released: 11/17/2000 Document Revised: 11/23/2011 Document Reviewed: 07/19/2011 °Elsevier Interactive Patient Education ©2016 Else[employer] Inc[employer] ° °Abscess °An abscess (boil or furuncle) is an infected area on or under the skin. This area is filled with yellowish-white fluid (pus) and other material (debris). °HOME CARE  °· Only take medicines as told by your doctor. °· If you were given antibiotic medicine, take it as directed. Finish the medicine even if you start to feel better. °· If gauze is used, follow your doctor's directions for changing the gauze. °· To avoid spreading the infection: °¨ Keep your abscess covered with a bandage. °¨ Wash your hands well. °¨ Do not share personal care items, towels, or whirlpools with others. °¨ Avoid skin contact with others. °· Keep your skin and clothes clean around the abscess. °· Keep all doctor visits as told. °GET HELP RIGHT AWAY IF:  °· You have more pain, puffiness (swelling), or redness in the wound site. °· You have more fluid or blood coming from the wound site. °· You have muscle aches, chills, or you feel sick. °· You have a fever. °MAKE SURE YOU:  °· Understand these instructions. °· Will watch your condition. °· Will get help right away if you are not doing well or get worse. °  °This information is not intended to replace advice given to you by your health care provider.   Make sure you discuss any questions you have with your health care provider.   Document Released: 11/10/2007 Document Revised: 11/23/2011 Document Reviewed: 08/07/2011 Elsevier Interactive Patient Education 2016 [place] have been started on antibiotic due to the number of abscesses that you have please apply a warm compress to each and every one of these twice a day for the next 3 days as well as take the antibiotic as directed

## 2015-03-17 NOTE — ED Notes (Signed)
Patient was alert, oriented and stable upon discharge. RN went over AVS and patient had no further questions.  

## 2015-07-15 ENCOUNTER — Encounter (HOSPITAL_COMMUNITY): Payer: Self-pay

## 2015-07-15 ENCOUNTER — Emergency Department (HOSPITAL_COMMUNITY)
Admission: EM | Admit: 2015-07-15 | Discharge: 2015-07-15 | Disposition: A | Discharge status: Home / Self Care | Payer: Self-pay | Attending: Emergency Medicine | Admitting: Emergency Medicine

## 2015-07-15 DIAGNOSIS — Z87891 Personal history of nicotine dependence: Secondary | ICD-10-CM | POA: Insufficient documentation

## 2015-07-15 DIAGNOSIS — Z8719 Personal history of other diseases of the digestive system: Secondary | ICD-10-CM | POA: Insufficient documentation

## 2015-07-15 DIAGNOSIS — Z3202 Encounter for pregnancy test, result negative: Secondary | ICD-10-CM | POA: Insufficient documentation

## 2015-07-15 DIAGNOSIS — R102 Pelvic and perineal pain: Secondary | ICD-10-CM

## 2015-07-15 DIAGNOSIS — R110 Nausea: Secondary | ICD-10-CM | POA: Insufficient documentation

## 2015-07-15 DIAGNOSIS — R21 Rash and other nonspecific skin eruption: Secondary | ICD-10-CM

## 2015-07-15 DIAGNOSIS — B9689 Other specified bacterial agents as the cause of diseases classified elsewhere: Secondary | ICD-10-CM

## 2015-07-15 DIAGNOSIS — N760 Acute vaginitis: Secondary | ICD-10-CM | POA: Insufficient documentation

## 2015-07-15 DIAGNOSIS — N764 Abscess of vulva: Secondary | ICD-10-CM | POA: Insufficient documentation

## 2015-07-15 LAB — URINALYSIS, ROUTINE W REFLEX MICROSCOPIC
Bilirubin Urine: NEGATIVE
Glucose, UA: NEGATIVE mg/dL
Ketones, ur: NEGATIVE mg/dL
NITRITE: NEGATIVE
PROTEIN: NEGATIVE mg/dL
SPECIFIC GRAVITY, URINE: 1.005 (ref 1.005–1.030)
pH: 6 (ref 5.0–8.0)

## 2015-07-15 LAB — GRAM STAIN

## 2015-07-15 LAB — URINE MICROSCOPIC-ADD ON

## 2015-07-15 LAB — POC URINE PREG, ED: PREG TEST UR: NEGATIVE

## 2015-07-15 LAB — WET PREP, GENITAL
Sperm: NONE SEEN
Trich, Wet Prep: NONE SEEN
YEAST WET PREP: NONE SEEN

## 2015-07-15 MED ORDER — FOSFOMYCIN TROMETHAMINE 3 G PO PACK
3.0000 g | PACK | Freq: Once | ORAL | Status: AC
  Administered 2015-07-15: 3 g via ORAL
  Filled 2015-07-15: qty 3

## 2015-07-15 MED ORDER — METRONIDAZOLE 500 MG PO TABS: 500.0000 mg | ORAL_TABLET | Freq: Two times a day (BID) | ORAL | Status: AC

## 2015-07-15 MED ORDER — ACYCLOVIR 400 MG PO TABS
400.0000 mg | ORAL_TABLET | Freq: Three times a day (TID) | ORAL | Status: AC
Start: 2015-07-15 — End: 2015-07-22

## 2015-07-15 MED ORDER — LIDOCAINE-EPINEPHRINE 1 %-1:100000 IJ SOLN
10.0000 mL | Freq: Once | INTRAMUSCULAR | Status: AC
  Administered 2015-07-15: 10 mL via INTRADERMAL
  Filled 2015-07-15: qty 1

## 2015-07-15 NOTE — ED Notes (Signed)
Pt reports she had intercourse this past weekend and since then she has been feeling a burning sensation to her vaginal area, worse when urinating. She also reports she also has noticed a "knot" to left labia majora area.

## 2015-07-15 NOTE — Discharge Instructions (Signed)
Abscess °An abscess is an infected area that contains a collection of pus and debris. It can occur in almost any part of the body. An abscess is also known as a furuncle or boil. °CAUSES  °An abscess occurs when tissue gets infected. This can occur from blockage of oil or sweat glands, infection of hair follicles, or a minor injury to the skin. As the body tries to fight the infection, pus collects in the area and creates pressure under the skin. This pressure causes pain. People with weakened immune systems have difficulty fighting infections and get certain abscesses more often.  °SYMPTOMS °Usually an abscess develops on the skin and becomes a painful mass that is red, warm, and tender. If the abscess forms under the skin, you may feel a moveable soft area under the skin. Some abscesses break open (rupture) on their own, but most will continue to get worse without care. The infection can spread deeper into the body and eventually into the bloodstream, causing you to feel ill.  °DIAGNOSIS  °Your caregiver will take your medical history and perform a physical exam. A sample of fluid may also be taken from the abscess to determine what is causing your infection. °TREATMENT  °Your caregiver may prescribe antibiotic medicines to fight the infection. However, taking antibiotics alone usually does not cure an abscess. Your caregiver may need to make a small cut (incision) in the abscess to drain the pus. In some cases, gauze is packed into the abscess to reduce pain and to continue draining the area. °HOME CARE INSTRUCTIONS  °· Only take over-the-counter or prescription medicines for pain, discomfort, or fever as directed by your caregiver. °· If you were prescribed antibiotics, take them as directed. Finish them even if you start to feel better. °· If gauze is used, follow your caregiver's directions for changing the gauze. °· To avoid spreading the infection: °¨ Keep your draining abscess covered with a  bandage. °¨ Wash your hands well. °¨ Do not share personal care items, towels, or whirlpools with others. °¨ Avoid skin contact with others. °· Keep your skin and clothes clean around the abscess. °· Keep all follow-up appointments as directed by your caregiver. °SEEK MEDICAL CARE IF:  °· You have increased pain, swelling, redness, fluid drainage, or bleeding. °· You have muscle aches, chills, or a general ill feeling. °· You have a fever. °MAKE SURE YOU:  °· Understand these instructions. °· Will watch your condition. °· Will get help right away if you are not doing well or get worse. °  °This information is not intended to replace advice given to you by your health care provider. Make sure you discuss any questions you have with your health care provider. °  °Document Released: 03/03/2005 Document Revised: 11/23/2011 Document Reviewed: 08/06/2011 °Elsevier Interactive Patient Education ©2016 Elsevier Inc. ° °Bacterial Vaginosis °Bacterial vaginosis is a vaginal infection that occurs when the normal balance of bacteria in the vagina is disrupted. It results from an overgrowth of certain bacteria. This is the most common vaginal infection in women of childbearing age. Treatment is important to prevent complications, especially in pregnant women, as it can cause a premature delivery. °CAUSES  °Bacterial vaginosis is caused by an increase in harmful bacteria that are normally present in smaller amounts in the vagina. Several different kinds of bacteria can cause bacterial vaginosis. However, the reason that the condition develops is not fully understood. °RISK FACTORS °Certain activities or behaviors can put you at an increased risk of   developing bacterial vaginosis, including: °· Having a new sex partner or multiple sex partners. °· Douching. °· Using an intrauterine device (IUD) for contraception. °Women do not get bacterial vaginosis from toilet seats, bedding, swimming pools, or contact with objects around  them. °SIGNS AND SYMPTOMS  °Some women with bacterial vaginosis have no signs or symptoms. Common symptoms include: °· Grey vaginal discharge. °· A fishlike odor with discharge, especially after sexual intercourse. °· Itching or burning of the vagina and vulva. °· Burning or pain with urination. °DIAGNOSIS  °Your health care provider will take a medical history and examine the vagina for signs of bacterial vaginosis. A sample of vaginal fluid may be taken. Your health care provider will look at this sample under a microscope to check for bacteria and abnormal cells. A vaginal pH test may also be done.  °TREATMENT  °Bacterial vaginosis may be treated with antibiotic medicines. These may be given in the form of a pill or a vaginal cream. A second round of antibiotics may be prescribed if the condition comes back after treatment. Because bacterial vaginosis increases your risk for sexually transmitted diseases, getting treated can help reduce your risk for chlamydia, gonorrhea, HIV, and herpes. °HOME CARE INSTRUCTIONS  °· Only take over-the-counter or prescription medicines as directed by your health care provider. °· If antibiotic medicine was prescribed, take it as directed. Make sure you finish it even if you start to feel better. °· Tell all sexual partners that you have a vaginal infection. They should see their health care provider and be treated if they have problems, such as a mild rash or itching. °· During treatment, it is important that you follow these instructions: °¨ Avoid sexual activity or use condoms correctly. °¨ Do not douche. °¨ Avoid alcohol as directed by your health care provider. °¨ Avoid breastfeeding as directed by your health care provider. °SEEK MEDICAL CARE IF:  °· Your symptoms are not improving after 3 days of treatment. °· You have increased discharge or pain. °· You have a fever. °MAKE SURE YOU:  °· Understand these instructions. °· Will watch your condition. °· Will get help right away  if you are not doing well or get worse. °FOR MORE INFORMATION  °Centers for Disease Control and Prevention, Division of STD Prevention: www.cdc.gov/std °American Sexual Health Association (ASHA): www.ashastd.org  °  °This information is not intended to replace advice given to you by your health care provider. Make sure you discuss any questions you have with your health care provider. °  °Document Released: 05/24/2005 Document Revised: 06/14/2014 Document Reviewed: 01/03/2013 °Elsevier Interactive Patient Education ©2016 Elsevier Inc. ° °

## 2015-07-15 NOTE — ED Provider Notes (Signed)
CSN: [id number]     Arrival date & time 07/15/15  1541 History   First MD Initiated Contact with Patient 07/15/15 2028     Chief Complaint  Patient presents with  . Vaginal Pain     (Consider location/radiation/quality/duration/timing/severity/associated sxs/prior Treatment) Patient is a 35 y.o. female presenting with female genitourinary complaint. The history is provided by the patient.  Female GU Problem This is a new problem. The current episode started in the past 7 days. The problem occurs constantly. The problem has been gradually worsening. Associated symptoms include nausea and urinary symptoms (burning urination). Pertinent negatives include no abdominal pain, chest pain, chills, coughing, fever, vomiting or weakness. Nothing aggravates the symptoms. She has tried nothing for the symptoms. The treatment provided no relief.    Past Medical History  Diagnosis Date  . IBS (irritable bowel syndrome)    Past Surgical History  Procedure Laterality Date  . Cholecystectomy    . Tubal ligation    . Nose surgery    . Cesarean section    . Wisdom tooth extraction     No family history on file. Social History  Substance Use Topics  . Smoking status: Former Smoker    Types: Cigarettes  . Smokeless tobacco: Never Used  . Alcohol Use: Yes     Comment: rare   OB History    Gravida Para Term Preterm AB TAB SAB Ectopic Multiple Living   4 3   1  1   3       Obstetric Comments   1st Menstrual Cycle: 12 1st Pregnancy:  17     Review of Systems  Constitutional: Negative for fever and chills.  HENT: Negative.   Eyes: Negative for visual disturbance.  Respiratory: Negative for cough and shortness of breath.   Cardiovascular: Negative for chest pain.  Gastrointestinal: Positive for nausea. Negative for vomiting and abdominal pain.  Genitourinary: Positive for dysuria, vaginal discharge, genital sores and vaginal pain. Negative for decreased urine volume.  Musculoskeletal:  Negative.   Skin: Negative.   Neurological: Negative for weakness.      Allergies  Oxycodone  Home Medications   Prior to Admission medications   Medication Sig Start Date End Date Taking? Authorizing Provider  acyclovir (ZOVIRAX) 400 MG tablet Take 1 tablet (400 mg total) by mouth 3 (three) times daily. 07/15/15 07/22/15  Heriberto Antigua, MD  doxycycline (VIBRA-TABS) 100 MG tablet Take 1 tablet (100 mg total) by mouth 2 (two) times daily. Patient not taking: Reported on 07/15/2015 03/17/15   Junius Creamer, NP  HYDROcodone-acetaminophen (NORCO/VICODIN) 5-325 MG tablet Take 1 tablet by mouth every 4 (four) hours as needed for moderate pain. Patient not taking: Reported on 07/15/2015 03/17/15   Junius Creamer, NP  metroNIDAZOLE (FLAGYL) 500 MG tablet Take 1 tablet (500 mg total) by mouth 2 (two) times daily. 07/15/15 07/22/15  Heriberto Antigua, MD   BP 92/75 mmHg  Pulse 72  Temp(Src) 98 F (36.7 C) (Oral)  Resp 16  SpO2 99%  LMP 07/01/2015 Physical Exam  Constitutional: She is oriented to person, place, and time. She appears well-developed and well-nourished. No distress.  HENT:  Head: Normocephalic and atraumatic.  Mouth/Throat: No oropharyngeal exudate.  Eyes: EOM are normal. Pupils are equal, round, and reactive to light.  Neck: Normal range of motion. Neck supple.  Cardiovascular: Normal rate, regular rhythm, normal heart sounds and intact distal pulses.   Pulmonary/Chest: Effort normal and breath sounds normal. No respiratory distress. She exhibits no tenderness.  Abdominal:  Soft. She exhibits no distension. There is no tenderness. There is no rebound and no guarding.  Genitourinary:    There is rash, tenderness and lesion on the right labia. Cervix exhibits no motion tenderness (with some mild cervical bleeding). Right adnexum displays no tenderness. Left adnexum displays no tenderness. Vaginal discharge found.  Musculoskeletal: Normal range of motion. She exhibits no edema or tenderness.    Neurological: She is alert and oriented to person, place, and time. No cranial nerve deficit. She exhibits normal muscle tone. Coordination normal.  Skin: Skin is warm and dry. No rash noted. She is not diaphoretic. No erythema. No pallor.  Psychiatric: She has a normal mood and affect.    ED Course  .Marland KitchenIncision and Drainage Date/Time: 07/15/2015 10:23 PM Performed by: Heriberto Antigua Authorized by: Heriberto Antigua Consent: Verbal consent obtained. Written consent not obtained. Risks and benefits: risks, benefits and alternatives were discussed Consent given by: patient Patient identity confirmed: verbally with patient Time out: Immediately prior to procedure a "time out" was called to verify the correct patient, procedure, equipment, support staff and site/side marked as required. Type: abscess Body area: anogenital Location details: vulva Anesthesia: local infiltration Local anesthetic: lidocaine 1% with epinephrine Anesthetic total: 1 ml Patient sedated: no Scalpel size: 11 Incision type: single straight Incision depth: dermal Complexity: simple Drainage: purulent Drainage amount: scant Wound treatment: wound left open Packing material: 1/2 in gauze Patient tolerance: Patient tolerated the procedure well with no immediate complications   (including critical care time) Labs Review Labs Reviewed  WET PREP, GENITAL - Abnormal; Notable for the following:    Clue Cells Wet Prep HPF POC PRESENT (*)    WBC, Wet Prep HPF POC FEW (*)    All other components within normal limits  URINALYSIS, ROUTINE W REFLEX MICROSCOPIC (NOT AT [facility]) - Abnormal; Notable for the following:    APPearance CLOUDY (*)    Hgb urine dipstick SMALL (*)    Leukocytes, UA SMALL (*)    All other components within normal limits  URINE MICROSCOPIC-ADD ON - Abnormal; Notable for the following:    Squamous Epithelial / LPF 0-5 (*)    Bacteria, UA RARE (*)    All other components within normal limits  URINE  CULTURE  GRAM STAIN  HERPES SIMPLEX VIRUS CULTURE  POC URINE PREG, ED  GC/CHLAMYDIA PROBE AMP ([place]) NOT AT [facility]    Imaging Review No results found. I have personally reviewed and evaluated these images and lab results as part of my medical decision-making.   EKG Interpretation None      MDM   Final diagnoses:  BV (bacterial vaginosis)  Vaginal pain  Abscess of labia majora  Rash/skin eruption    Patient is a 35 year old female presents with painful urination and vaginal burning as well as what appears to be a left labial abscess, which all have been worsening since Saturday. Only inciting event that she can think of is intercourse on Saturday. Otherwise denies any fevers or abdominal pain, vaginal bleeding but does report some vaginal discharge. No history of STDs in the past. Further history and exam as above notable for stable vital signs and what appears to be a small abscess of her left labia majora as well as a skin eruption that is very tender to palpation of her right labia minora. Abscess drained as above. UA obtained with small blood and small leuks. Wet prep positive for clue cells. Patient will be treated with fosfomycin for concerns of UTI  as and will be treated with Flagyl for BV, GC chlamydia and herpes simplex swabs obtained.  I have reviewed all labs. Patient stable for discharge home.  I have reviewed all results with the patient. Advised to f/u with her pcp or obgyn within 7 days and call Friday morning to f/u culture results. Will rx acyclovir to take until cultures result. Will rx flagyl. Patient agrees to stated plan. All questions answered. Advised to call or return to have any questions, new symptoms, change in symptoms, or symptoms that they do not understand.     Heriberto Antigua, MD 07/16/15 [place], MD 07/17/15 [id number]

## 2015-07-16 LAB — GC/CHLAMYDIA PROBE AMP (~~LOC~~) NOT AT ARMC
CHLAMYDIA, DNA PROBE: NEGATIVE
NEISSERIA GONORRHEA: NEGATIVE

## 2015-07-17 LAB — HERPES SIMPLEX VIRUS CULTURE
Culture: NOT DETECTED
Special Requests: NORMAL

## 2015-07-17 LAB — URINE CULTURE: Culture: >=100000

## 2015-07-18 ENCOUNTER — Telehealth (HOSPITAL_BASED_OUTPATIENT_CLINIC_OR_DEPARTMENT_OTHER): Payer: Self-pay | Admitting: Emergency Medicine

## 2015-07-18 NOTE — Telephone Encounter (Signed)
Post ED Visit - Positive Culture Follow-up  Culture report reviewed by antimicrobial stewardship pharmacist:  [x]  Elenor Quinones, Pharm.D. []  Heide Guile, Pharm.D., BCPS []  Parks Neptune, Pharm.D. []  Alycia Rossetti, Pharm.D., BCPS []  [place], Pharm.D., BCPS, AAHIVP []  Legrand Como, Pharm.D., BCPS, AAHIVP []  Milus Glazier, Pharm.D. []  Rob Evette Doffing, Pharm.D.  Positive urine culture Staph Treated with metronidazole, organism sensitive to the same and no further patient follow-up is required at this time.  Hazle Nordmann 07/18/2015, 9:06 AM

## 2015-09-16 ENCOUNTER — Other Ambulatory Visit (HOSPITAL_COMMUNITY): Payer: Self-pay | Admitting: *Deleted

## 2015-09-16 DIAGNOSIS — N644 Mastodynia: Secondary | ICD-10-CM

## 2015-09-25 ENCOUNTER — Other Ambulatory Visit: Payer: Self-pay | Admitting: Obstetrics and Gynecology

## 2015-09-25 ENCOUNTER — Encounter (HOSPITAL_COMMUNITY): Payer: Self-pay

## 2015-09-25 ENCOUNTER — Ambulatory Visit
Admission: RE | Admit: 2015-09-25 | Discharge: 2015-09-25 | Disposition: A | Discharge status: Home / Self Care | Payer: No Typology Code available for payment source | Source: Ambulatory Visit | Attending: Obstetrics and Gynecology | Admitting: Obstetrics and Gynecology

## 2015-09-25 ENCOUNTER — Ambulatory Visit (HOSPITAL_COMMUNITY)
Admission: RE | Admit: 2015-09-25 | Discharge: 2015-09-25 | Disposition: A | Discharge status: Home / Self Care | Payer: Self-pay | Source: Ambulatory Visit | Attending: Obstetrics and Gynecology | Admitting: Obstetrics and Gynecology

## 2015-09-25 VITALS — BP 104/68 | Temp 98.8°F | Ht 66.0 in | Wt 169.2 lb

## 2015-09-25 DIAGNOSIS — Z01419 Encounter for gynecological examination (general) (routine) without abnormal findings: Secondary | ICD-10-CM

## 2015-09-25 DIAGNOSIS — N644 Mastodynia: Secondary | ICD-10-CM

## 2015-09-25 DIAGNOSIS — N898 Other specified noninflammatory disorders of vagina: Secondary | ICD-10-CM

## 2015-09-25 NOTE — Patient Instructions (Signed)
Educational materials on self breast awareness given. Explained to Tammy Buckley that depending on the result to today's Pap smear will determine when next Pap smear is due. Referred patient to the [facility] for diagnostic mammogram. Appointment scheduled for Thursday, September 25, 2015 at 1300. Let patient know will follow up with her within the next couple weeks with results of Pap smear by phone. Tammy Buckley verbalized understanding.  Braylynn Ghan, [place]vil [place], RN 1:46 PM

## 2015-09-25 NOTE — Progress Notes (Signed)
Complaints of right shooting breast pain that starts in the outer breast radiating to the center. Patient states the pain feels like an electric shock. Patient rates pain at a 6 out of 10.  Pap Smear: Pap smear completed today. Last Pap smear was 6 years ago and normal per patient. Per patient has a history of two abnormal pap Smears. The first Pap smear was in 1999 and a colposcopy and LEEP was completed for follow up. The second abnormal was in 2008 and a colposcopy was completed for follow up. Per patient all Pap smears have been normal since last colposcopy. No Pap smear results in EPIC.  Physical exam: Breasts Breasts symmetrical. No skin abnormalities bilateral breasts. No nipple retraction bilateral breasts. No nipple discharge bilateral breasts. No lymphadenopathy. No lumps palpated bilateral breasts. Complaints of right upper outer quadrant of breast tenderness on exam. Referred [facility] to [facility] for diagnostic [facility]. Appointment scheduled for Thursday, September 25, 2015 at 1300.  Pelvic/Bimanual   Ext Genitalia No lesions, no swelling and no discharge observed on external genitalia.         Vagina Vagina pink and normal texture. No lesions and thick white discharge observed in vagina. Wet Prep completed.          Cervix Cervix is present. Cervix pink and of normal texture. Thick white discharge observed on cervix.    Uterus Uterus is present and palpable. Uterus in normal position and normal size.        Adnexae Bilateral ovaries present and palpable. No tenderness on palpation.          Rectovaginal No rectal exam completed today since patient had no rectal complaints. No skin abnormalities observed on exam.    Smoking History: Patient has never smoked.  Patient Navigation: Patient education provided. Access to services provided for patient through [facility] program.

## 2015-09-26 ENCOUNTER — Encounter (HOSPITAL_COMMUNITY): Payer: Self-pay | Admitting: *Deleted

## 2015-09-29 LAB — CYTOLOGY - PAP

## 2015-10-02 LAB — WET PREP, GENITAL
Clue Cell Exam: NEGATIVE
Trichomonas Exam: NEGATIVE
Yeast Exam: NEGATIVE

## 2015-10-02 LAB — TRICHOMONAS CULTURE

## 2015-10-08 ENCOUNTER — Encounter: Payer: Self-pay | Admitting: Obstetrics and Gynecology

## 2015-10-09 ENCOUNTER — Telehealth (HOSPITAL_COMMUNITY): Payer: Self-pay | Admitting: *Deleted

## 2015-10-09 NOTE — Telephone Encounter (Signed)
Telephoned patient at home # and discussed abnormal pap smear results. Advised patient need for colposcopy and appointment is scheduled at [facility] on June 5 1:10. All questions were answered. Patient voiced understanding.

## 2015-10-28 ENCOUNTER — Encounter (HOSPITAL_COMMUNITY): Payer: Self-pay

## 2015-10-28 ENCOUNTER — Ambulatory Visit (HOSPITAL_COMMUNITY)
Admission: EM | Admit: 2015-10-28 | Discharge: 2015-10-28 | Disposition: A | Discharge status: Home / Self Care | Payer: No Typology Code available for payment source | Attending: Family Medicine | Admitting: Family Medicine

## 2015-10-28 DIAGNOSIS — L0390 Cellulitis, unspecified: Secondary | ICD-10-CM

## 2015-10-28 DIAGNOSIS — L0291 Cutaneous abscess, unspecified: Secondary | ICD-10-CM

## 2015-10-28 DIAGNOSIS — Z87891 Personal history of nicotine dependence: Secondary | ICD-10-CM | POA: Insufficient documentation

## 2015-10-28 DIAGNOSIS — K589 Irritable bowel syndrome without diarrhea: Secondary | ICD-10-CM | POA: Insufficient documentation

## 2015-10-28 DIAGNOSIS — L02425 Furuncle of right lower limb: Secondary | ICD-10-CM | POA: Insufficient documentation

## 2015-10-28 MED ORDER — CLINDAMYCIN HCL 300 MG PO CAPS: 300.0000 mg | ORAL_CAPSULE | Freq: Four times a day (QID) | ORAL | Status: DC

## 2015-10-28 MED ORDER — MUPIROCIN CALCIUM 2 % EX CREA: 1.0000 "application " | TOPICAL_CREAM | Freq: Two times a day (BID) | CUTANEOUS | Status: DC

## 2015-10-28 MED ORDER — LIDOCAINE-EPINEPHRINE (PF) 2 %-1:200000 IJ SOLN
INTRAMUSCULAR | Status: AC
  Filled 2015-10-28: qty 20

## 2015-10-28 NOTE — Discharge Instructions (Signed)
Abscess An abscess (boil or furuncle) is an infected area on or under the skin. This area is filled with yellowish-white fluid (pus) and other material (debris). HOME CARE   Only take medicines as told by your doctor.  If you were given antibiotic medicine, take it as directed. Finish the medicine even if you start to feel better.  If gauze is used, follow your doctor's directions for changing the gauze.  To avoid spreading the infection:  Keep your abscess covered with a bandage.  Wash your hands well.  Do not share personal care items, towels, or whirlpools with others.  Avoid skin contact with others.  Keep your skin and clothes clean around the abscess.  Keep all doctor visits as told. GET HELP RIGHT AWAY IF:   You have more pain, puffiness (swelling), or redness in the wound site.  You have more fluid or blood coming from the wound site.  You have muscle aches, chills, or you feel sick.  You have a fever. MAKE SURE YOU:   Understand these instructions.  Will watch your condition.  Will get help right away if you are not doing well or get worse.   This information is not intended to replace advice given to you by your health care provider. Make sure you discuss any questions you have with your health care provider.   Document Released: 11/10/2007 Document Revised: 11/23/2011 Document Reviewed: 08/07/2011 Elsevier Interactive Patient Education 2016 Elsevier Inc[employer]  Cellulitis Cellulitis is an infection of the skin and the tissue under the skin. The infected area is usually red and tender. This happens most often in the arms and lower legs. HOME CARE   Take your antibiotic medicine as told. Finish the medicine even if you start to feel better.  Keep the infected arm or leg raised (elevated).  Put a warm cloth on the area up to 4 times per day.  Only take medicines as told by your doctor.  Keep all doctor visits as told. GET HELP IF:  You see red streaks on  the skin coming from the infected area.  Your red area gets bigger or turns a dark color.  Your bone or joint under the infected area is painful after the skin heals.  Your infection comes back in the same area or different area.  You have a puffy (swollen) bump in the infected area.  You have new symptoms.  You have a fever. GET HELP RIGHT AWAY IF:   You feel very sleepy.  You throw up (vomit) or have watery poop (diarrhea).  You feel sick and have muscle aches and pains.   This information is not intended to replace advice given to you by your health care provider. Make sure you discuss any questions you have with your health care provider.   Document Released: 11/10/2007 Document Revised: 02/12/2015 Document Reviewed: 08/09/2011 Elsevier Interactive Patient Education [employer].

## 2015-10-28 NOTE — ED Notes (Signed)
Patient presents with boil on upper right thigh x1 week, pt has been using hot compress to relieve pain No acute distress

## 2015-10-28 NOTE — ED Provider Notes (Signed)
CSN: [id number]     Arrival date & time 10/28/15  1733 History   First MD Initiated Contact with Patient 10/28/15 1805     No chief complaint on file.  (Consider location/radiation/quality/duration/timing/severity/associated sxs/prior Treatment) HPI History obtained from patient:  Pt presents with the cc of: Boil right thigh Duration of symptoms: Almost 1 week Treatment prior to arrival: Hot compresses, drawing salve Context: Redness appeared about a week ago because somewhat better than she went to the beach things got worse Other symptoms include: Some pain to walk Pain score: 3 FAMILY HISTORY: Hypertension-mother    Past Medical History  Diagnosis Date  . IBS (irritable bowel syndrome)    Past Surgical History  Procedure Laterality Date  . Cholecystectomy    . Tubal ligation    . Nose surgery    . Cesarean section    . Wisdom tooth extraction    . Breast surgery      right breast surgery   Family History  Problem Relation Age of Onset  . Hypertension Mother   . Diabetes Mother   . Stroke Mother   . Kidney failure Mother   . Heart failure Mother   . Diabetes Father    Social History  Substance Use Topics  . Smoking status: Former Smoker    Types: Cigarettes    Quit date: 11/05/2013  . Smokeless tobacco: Never Used  . Alcohol Use: Yes     Comment: rare   OB History    Gravida Para Term Preterm AB TAB SAB Ectopic Multiple Living   5 4   1  1   4       Obstetric Comments   1st Menstrual Cycle: 12 1st Pregnancy:  17     Review of Systems  Denies: HEADACHE, NAUSEA, ABDOMINAL PAIN, CHEST PAIN, CONGESTION, DYSURIA, SHORTNESS OF BREATH  Allergies  Oxycodone  Home Medications   Prior to Admission medications   Medication Sig Start Date End Date Taking? Authorizing Provider  doxycycline (VIBRA-TABS) 100 MG tablet Take 1 tablet (100 mg total) by mouth 2 (two) times daily. Patient not taking: Reported on 07/15/2015 03/17/15   Junius Creamer, NP   HYDROcodone-acetaminophen (NORCO/VICODIN) 5-325 MG tablet Take 1 tablet by mouth every 4 (four) hours as needed for moderate pain. Patient not taking: Reported on 07/15/2015 03/17/15   Junius Creamer, NP   Meds Ordered and Administered this Visit  Medications - No data to display  BP 103/68 mmHg  Pulse 95  Temp(Src) 97.9 F (36.6 C) (Oral)  Resp 16  SpO2 100% No data found.   Physical Exam NURSES NOTES AND VITAL SIGNS REVIEWED. CONSTITUTIONAL: Well developed, well nourished, no acute distress HEENT: normocephalic, atraumatic EYES: Conjunctiva normal NECK:normal ROM, supple, no adenopathy PULMONARY:No respiratory distress, normal effort ABDOMINAL: Soft, ND, NT BS+, No CVAT MUSCULOSKELETAL: Normal ROM of all extremities, right upper thigh there is a 2 cm fluctuant red lesion. Tender to palpation. No lymphangitic  streaking. SKIN: warm and dry without rash PSYCHIATRIC: Mood and affect, behavior are normal  ED Course  .Marland KitchenIncision and Drainage Date/Time: 10/28/2015 6:15 PM Performed by: Konrad Felix Authorized by: Ihor Gully D Consent: Verbal consent obtained. Risks and benefits: risks, benefits and alternatives were discussed Consent given by: patient Patient identity confirmed: verbally with patient and arm band Time out: Immediately prior to procedure a "time out" was called to verify the correct patient, procedure, equipment, support staff and site/side marked as required. Type: abscess Body area: lower extremity (Right medial thigh)  Anesthesia: local infiltration Local anesthetic: lidocaine 1% with epinephrine and co-phenylcaine spray Patient sedated: no Scalpel size: 11 Incision type: single straight Incision depth: subcutaneous Complexity: simple Drainage: purulent Drainage amount: moderate Packing material: 1/4 in iodoform gauze Patient tolerance: Patient tolerated the procedure well with no immediate complications   (including critical care time)  Labs  Review Labs Reviewed - No data to display  Imaging Review No results found.   Visual Acuity Review  Right Eye Distance:   Left Eye Distance:   Bilateral Distance:    Right Eye Near:   Left Eye Near:    Bilateral Near:       rx clindamycin, Bactroban  MDM   1. Abscess and cellulitis    .   Patient is reassured that there are no issues that require transfer to higher level of care at this time or additional tests. Patient is advised to continue home symptomatic treatment. Patient is advised that if there are new or worsening symptoms to attend the emergency department, contact primary care provider, or return to UC. Instructions of care provided discharged home in stable condition.    THIS NOTE WAS GENERATED USING A VOICE RECOGNITION SOFTWARE PROGRAM. ALL REASONABLE EFFORTS  WERE MADE TO PROOFREAD THIS DOCUMENT FOR ACCURACY.  I have verbally reviewed the discharge instructions with the patient. A printed AVS was given to the patient.  All questions were answered prior to discharge.      Konrad Felix, PA 10/28/15 Einar Crow

## 2015-10-31 LAB — AEROBIC CULTURE W GRAM STAIN (SUPERFICIAL SPECIMEN)

## 2015-10-31 LAB — AEROBIC CULTURE  (SUPERFICIAL SPECIMEN)

## 2015-11-10 ENCOUNTER — Ambulatory Visit (INDEPENDENT_AMBULATORY_CARE_PROVIDER_SITE_OTHER): Payer: Self-pay | Admitting: Obstetrics and Gynecology

## 2015-11-10 ENCOUNTER — Encounter: Payer: Self-pay | Admitting: Obstetrics and Gynecology

## 2015-11-10 ENCOUNTER — Other Ambulatory Visit (HOSPITAL_COMMUNITY)
Admission: RE | Admit: 2015-11-10 | Discharge: 2015-11-10 | Disposition: A | Discharge status: Home / Self Care | Payer: No Typology Code available for payment source | Source: Ambulatory Visit | Attending: Obstetrics and Gynecology | Admitting: Obstetrics and Gynecology

## 2015-11-10 ENCOUNTER — Encounter: Payer: Self-pay | Admitting: General Practice

## 2015-11-10 VITALS — BP 114/57 | HR 51 | Wt 171.5 lb

## 2015-11-10 DIAGNOSIS — R87612 Low grade squamous intraepithelial lesion on cytologic smear of cervix (LGSIL): Secondary | ICD-10-CM

## 2015-11-10 LAB — POCT PREGNANCY, URINE: Preg Test, Ur: NEGATIVE

## 2015-11-10 NOTE — Progress Notes (Signed)
GYNECOLOGY CLINIC COLPOSCOPY VISIT AND PROCEDURE NOTE  35 y.o. [id number] here for colposcopy for lsil/hrhpv pap smear on 09/2015. Prior cervical cytology and/or colposcopy findings: abnormal pap long time ago peripartum, colpo "more or lessnormal.". The patient reports the following prior treatments to the vulva/vagina/cervix: none. The patient is a cigarette smoker. The patient is not immunosuppressed. The patient is not pregnant. The patient is not taking anticoagulants and denies allergy to iodine.  Patient given informed consent, signed copy in the chart, time out was performed. Urine pregnancy test performed and confirmed to be negative.  Placed in lithotomy position.   Gross findings:   Vagina: normal mucosa  Vulva: normal  Cervix: normal  Visualization after:  Acetic acid: mildly accentuated acetowhite margin, mosaicism  Green or blue filter: no abnormal vessels  Upper one-third of vagina examined, revealing: normal mucosa  Biopsies obtained: yes, x2 (7 and 11 o'clock)  ECC specimen obtained: yes  Colposcopy adequate? Yes  All specimens were labelled and sent to pathology.  Patient was given post procedure instructions.  Will follow up pathology and manage accordingly.      Laurey Arrow, MD OB/GYN Fellow

## 2015-11-11 ENCOUNTER — Encounter: Payer: Self-pay | Admitting: Obstetrics and Gynecology

## 2015-11-11 DIAGNOSIS — R87612 Low grade squamous intraepithelial lesion on cytologic smear of cervix (LGSIL): Secondary | ICD-10-CM | POA: Insufficient documentation

## 2015-11-12 ENCOUNTER — Telehealth: Payer: Self-pay | Admitting: *Deleted

## 2015-11-12 NOTE — Telephone Encounter (Signed)
-----   Message from [facility], MD sent at 11/11/2015  8:31 PM EDT ----- colpo shows cin 1. pls call to inform. Just needs pap with hpv co-test in 12 months. Thanks, Olen Cordial  ----- Message -----    From: Lab In [facility] Interface    Sent: 11/10/2015   1:11 PM      To: Gwynne Edinger, MD

## 2015-11-12 NOTE — Telephone Encounter (Signed)
Called patient and informed of colpo results and need for f/u with pap and hpv cotesting in 1 year. Patient voiced understanding.

## 2016-02-16 ENCOUNTER — Ambulatory Visit (HOSPITAL_COMMUNITY)
Admission: EM | Admit: 2016-02-16 | Discharge: 2016-02-16 | Disposition: A | Discharge status: Home / Self Care | Payer: No Typology Code available for payment source | Attending: Family Medicine | Admitting: Family Medicine

## 2016-02-16 ENCOUNTER — Encounter (HOSPITAL_COMMUNITY): Payer: Self-pay | Admitting: Family Medicine

## 2016-02-16 DIAGNOSIS — M542 Cervicalgia: Secondary | ICD-10-CM

## 2016-02-16 MED ORDER — CYCLOBENZAPRINE HCL 5 MG PO TABS: 5.0000 mg | ORAL_TABLET | Freq: Three times a day (TID) | ORAL | 0 refills | Status: DC
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
## 2016-02-24 ENCOUNTER — Encounter (HOSPITAL_COMMUNITY): Payer: Self-pay | Admitting: Emergency Medicine

## 2016-02-24 ENCOUNTER — Emergency Department (HOSPITAL_COMMUNITY): Payer: No Typology Code available for payment source

## 2016-02-24 ENCOUNTER — Emergency Department (HOSPITAL_COMMUNITY)
Admission: EM | Admit: 2016-02-24 | Discharge: 2016-02-24 | Disposition: A | Discharge status: Home / Self Care | Payer: No Typology Code available for payment source | Attending: Emergency Medicine | Admitting: Emergency Medicine

## 2016-02-24 DIAGNOSIS — S161XXA Strain of muscle, fascia and tendon at neck level, initial encounter: Secondary | ICD-10-CM | POA: Insufficient documentation

## 2016-02-24 DIAGNOSIS — Y92410 Unspecified street and highway as the place of occurrence of the external cause: Secondary | ICD-10-CM | POA: Diagnosis not present

## 2016-02-24 DIAGNOSIS — Y999 Unspecified external cause status: Secondary | ICD-10-CM | POA: Diagnosis not present

## 2016-02-24 DIAGNOSIS — Y939 Activity, unspecified: Secondary | ICD-10-CM | POA: Insufficient documentation

## 2016-02-24 DIAGNOSIS — F17210 Nicotine dependence, cigarettes, uncomplicated: Secondary | ICD-10-CM | POA: Diagnosis not present

## 2016-02-24 DIAGNOSIS — R6884 Jaw pain: Secondary | ICD-10-CM

## 2016-02-24 DIAGNOSIS — M26603 Bilateral temporomandibular joint disorder, unspecified: Secondary | ICD-10-CM | POA: Diagnosis not present

## 2016-02-24 DIAGNOSIS — S199XXA Unspecified injury of neck, initial encounter: Secondary | ICD-10-CM | POA: Diagnosis present

## 2016-02-24 DIAGNOSIS — M26623 Arthralgia of bilateral temporomandibular joint: Secondary | ICD-10-CM

## 2016-02-24 MED ORDER — IBUPROFEN 200 MG PO TABS
400.0000 mg | ORAL_TABLET | Freq: Once | ORAL | Status: AC
  Administered 2016-02-24: 400 mg via ORAL
  Filled 2016-02-24: qty 2
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
## 2016-02-24 NOTE — Discharge Instructions (Signed)
It was our pleasure to provide your ER care today - we hope that you feel better.  Take motrin or aleve as need for pain.  For back/neck pain, try heat therapy, and/or gentle massage to area for symptom relief.  Follow up with primary care doctor in 1 week if symptoms fail to improve/resolve.  Return to ER if worse, new symptoms, fevers, worsening/severe pain, other concern.

## 2016-02-24 NOTE — ED Triage Notes (Signed)
Pt sts HA in frontal area around to back; pt sts pain into face and teeth; pt sts some sinus issues
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
## 2017-01-07 ENCOUNTER — Emergency Department (HOSPITAL_COMMUNITY)
Admission: EM | Admit: 2017-01-07 | Discharge: 2017-01-07 | Disposition: A | Discharge status: Home / Self Care | Payer: No Typology Code available for payment source | Attending: Emergency Medicine | Admitting: Emergency Medicine

## 2017-01-07 ENCOUNTER — Encounter (HOSPITAL_COMMUNITY): Payer: Self-pay

## 2017-01-07 ENCOUNTER — Emergency Department (HOSPITAL_COMMUNITY): Payer: No Typology Code available for payment source

## 2017-01-07 DIAGNOSIS — M62838 Other muscle spasm: Secondary | ICD-10-CM

## 2017-01-07 DIAGNOSIS — M542 Cervicalgia: Secondary | ICD-10-CM | POA: Diagnosis present

## 2017-01-07 DIAGNOSIS — F17210 Nicotine dependence, cigarettes, uncomplicated: Secondary | ICD-10-CM | POA: Diagnosis not present

## 2017-01-07 LAB — CBC WITH DIFFERENTIAL/PLATELET
BASOS ABS: 0 10*3/uL (ref 0.0–0.1)
BASOS PCT: 0 %
EOS ABS: 0.1 10*3/uL (ref 0.0–0.7)
EOS PCT: 1 %
HCT: 41.1 % (ref 36.0–46.0)
Hemoglobin: 13.8 g/dL (ref 12.0–15.0)
Lymphocytes Relative: 31 %
Lymphs Abs: 2.2 10*3/uL (ref 0.7–4.0)
MCH: 28.8 pg (ref 26.0–34.0)
MCHC: 33.6 g/dL (ref 30.0–36.0)
MCV: 85.6 fL (ref 78.0–100.0)
MONO ABS: 0.5 10*3/uL (ref 0.1–1.0)
Monocytes Relative: 7 %
Neutro Abs: 4.3 10*3/uL (ref 1.7–7.7)
Neutrophils Relative %: 61 %
Platelets: 167 10*3/uL (ref 150–400)
RBC: 4.8 MIL/uL (ref 3.87–5.11)
RDW: 14.1 % (ref 11.5–15.5)
WBC: 7 10*3/uL (ref 4.0–10.5)

## 2017-01-07 LAB — COMPREHENSIVE METABOLIC PANEL
ALT: 26 U/L (ref 14–54)
AST: 16 U/L (ref 15–41)
Albumin: 4.3 g/dL (ref 3.5–5.0)
Alkaline Phosphatase: 80 U/L (ref 38–126)
Anion gap: 8 (ref 5–15)
BUN: 13 mg/dL (ref 6–20)
CALCIUM: 9.4 mg/dL (ref 8.9–10.3)
CO2: 24 mmol/L (ref 22–32)
CREATININE: 0.69 mg/dL (ref 0.44–1.00)
Chloride: 107 mmol/L (ref 101–111)
GFR calc Af Amer: >60 mL/min (ref >60)
GLUCOSE: 111 mg/dL — AB (ref 65–99)
Potassium: 3.6 mmol/L (ref 3.5–5.1)
SODIUM: 139 mmol/L (ref 135–145)
TOTAL PROTEIN: 7.3 g/dL (ref 6.5–8.1)
Total Bilirubin: 0.4 mg/dL (ref 0.3–1.2)

## 2017-01-07 LAB — I-STAT BETA HCG BLOOD, ED (MC, WL, AP ONLY): I-stat hCG, quantitative: <5 m[IU]/mL (ref <5)

## 2017-01-07 MED ORDER — DIAZEPAM 5 MG PO TABS
5.0000 mg | ORAL_TABLET | Freq: Once | ORAL | Status: AC
Start: 2017-01-07 — End: 2017-01-07
  Administered 2017-01-07: 5 mg via ORAL
  Filled 2017-01-07: qty 1

## 2017-01-07 MED ORDER — ONDANSETRON HCL 4 MG/2ML IJ SOLN
4.0000 mg | Freq: Once | INTRAMUSCULAR | Status: AC
  Administered 2017-01-07: 4 mg via INTRAVENOUS
  Filled 2017-01-07: qty 2

## 2017-01-07 MED ORDER — MORPHINE SULFATE (PF) 4 MG/ML IV SOLN
4.0000 mg | Freq: Once | INTRAVENOUS | Status: AC
  Administered 2017-01-07: 4 mg via INTRAVENOUS
  Filled 2017-01-07: qty 1

## 2017-01-07 MED ORDER — HYDROCODONE-ACETAMINOPHEN 5-325 MG PO TABS: 1.0000 | ORAL_TABLET | ORAL | 0 refills | Status: DC | PRN

## 2017-01-07 MED ORDER — SODIUM CHLORIDE 0.9 % IV BOLUS (SEPSIS)
1000.0000 mL | Freq: Once | INTRAVENOUS | Status: AC
  Administered 2017-01-07: 1000 mL via INTRAVENOUS

## 2017-01-07 MED ORDER — CYCLOBENZAPRINE HCL 5 MG PO TABS: 5.0000 mg | ORAL_TABLET | Freq: Three times a day (TID) | ORAL | 0 refills | Status: DC | PRN

## 2017-01-07 MED ORDER — IBUPROFEN 800 MG PO TABS
800.0000 mg | ORAL_TABLET | Freq: Three times a day (TID) | ORAL | 0 refills | Status: DC
Start: 2017-01-07 — End: 2018-07-20

## 2017-01-07 NOTE — ED Triage Notes (Signed)
Patient reports that she has been having neck pain since 12/2016. Patient states she woke this AM and felt a pop left neck. Patient states any movement causes pain on the left lateral neck. Patient states she used Biofreeze, ice and heat , but no relief.
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
## 2017-01-07 NOTE — Discharge Instructions (Signed)
You likely have muscle strain of your neck.   Take motrin every 6 hrs for 2 days then as needed.   Take flexeril as needed for muscle spasms.   Take vicodin for severe pain. Do NOT drive with it.   Rest for 2-3 days   See your doctor or spine doctor for follow up  Return to ER if you have worse neck pain or spasms, weakness, numbness.

## 2017-03-03 ENCOUNTER — Encounter (HOSPITAL_COMMUNITY): Payer: Self-pay

## 2017-03-03 ENCOUNTER — Ambulatory Visit (HOSPITAL_COMMUNITY)
Admission: RE | Admit: 2017-03-03 | Discharge: 2017-03-03 | Disposition: A | Discharge status: Home / Self Care | Payer: Self-pay | Source: Ambulatory Visit | Attending: Obstetrics and Gynecology | Admitting: Obstetrics and Gynecology

## 2017-03-03 ENCOUNTER — Encounter (HOSPITAL_COMMUNITY): Payer: Self-pay | Admitting: *Deleted

## 2017-03-03 VITALS — BP 118/80 | Ht 66.0 in | Wt 183.5 lb

## 2017-03-03 DIAGNOSIS — Z01419 Encounter for gynecological examination (general) (routine) without abnormal findings: Secondary | ICD-10-CM

## 2017-03-03 HISTORY — DX: Anemia, unspecified: D64.9

## 2017-03-03 NOTE — Patient Instructions (Signed)
Explained breast self awareness with Felecia Jan. Let patient know that if today's Pap smear is normal that her next Pap smear is due in one year due to her history of abnormal Pap smears. Let patient know will follow up with her within the next couple weeks with results of Pap smear by letter or phone. Discussed smoking cessation with patient. Referred patient to the [facility] Quitline and gave resources to the free smoking cessation classes at [facility]. Felecia Jan verbalized understanding.  Brannock, Arvil Chaco, RN 9:34 AM

## 2017-03-03 NOTE — Progress Notes (Signed)
Complaints of occasional right upper outer breast tenderness at times that patient stated has improved since last exam 09/25/2015.  Pap Smear: Pap smear completed today. Last Pap smear was 09/25/2015 at [facility] and LGSIL with positive HPV. 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 Per patient has a history of two other abnormal pap Smears. The first Pap smear was in 1999 and a colposcopy and LEEP was completed for follow up. The second abnormal was in 2008 and a colposcopy was completed for follow up. Last Pap smear and colposcopy result are in EPIC.  Physical exam: Breasts Breasts symmetrical. No skin abnormalities bilateral breasts. No nipple retraction bilateral breasts. No nipple discharge bilateral breasts. No lymphadenopathy. No lumps palpated bilateral breasts. No complaints of pain or tenderness on exam. Screening mammogram recommended at age 20 unless clinically indicated prior.  Pelvic/Bimanual   Ext Genitalia No lesions, no swelling and no discharge observed on external genitalia.         Vagina Vagina pink and normal texture. No lesions or discharge observed in vagina.          Cervix Cervix is present. Cervix pink and of normal texture. No discharge observed.     Uterus Uterus is present and palpable. Uterus in normal position and normal size.        Adnexae Bilateral ovaries present and palpable. No tenderness on palpation.          Rectovaginal No rectal exam completed today since patient had no rectal complaints. No skin abnormalities observed on exam.    Smoking History: Patient is a current smoker. Discussed smoking cessation with patient. Referred patient to the [facility] Quitline and gave resources to the free smoking cessation classes at [facility].  Patient Navigation: Patient education provided. Access to services provided for patient through [facility] program.

## 2017-03-08 ENCOUNTER — Other Ambulatory Visit (HOSPITAL_COMMUNITY): Payer: Self-pay | Admitting: *Deleted

## 2017-03-08 ENCOUNTER — Telehealth (HOSPITAL_COMMUNITY): Payer: Self-pay | Admitting: *Deleted

## 2017-03-08 DIAGNOSIS — N760 Acute vaginitis: Principal | ICD-10-CM

## 2017-03-08 DIAGNOSIS — B9689 Other specified bacterial agents as the cause of diseases classified elsewhere: Secondary | ICD-10-CM

## 2017-03-08 LAB — CYTOLOGY - PAP
DIAGNOSIS: NEGATIVE
HPV (WINDOPATH): DETECTED — AB

## 2017-03-08 MED ORDER — METRONIDAZOLE 500 MG PO TABS: 500.0000 mg | ORAL_TABLET | Freq: Two times a day (BID) | ORAL | 0 refills | Status: DC

## 2017-03-08 NOTE — Telephone Encounter (Signed)
-----   Message from Osborne Oman, MD sent at 03/08/2017 10:10 AM EDT ----- Please ask for HPV 16/18/45 genotyping. Is this covered by BCCCP? If not, she will need repeat cotesting in one year.  If genotyping is done, results will determine appropriate follow up.  Thanks  UAA ----- Message ----- From: Interface, Lab In [facility] Sent: 03/08/2017   9:13 AM To: Mora Bellman, MD

## 2017-03-08 NOTE — Telephone Encounter (Signed)
Telephoned patient at home number and advised patient of negative pap smear results. HPV was positive. Advised patient would need repeat pap in one year. Pap smear did show bacterial vaginosis and medication was called into pharmacy. Advised patient to finish all medication and no alcohol while taking medication. Patient voiced understanding.

## 2017-03-14 ENCOUNTER — Other Ambulatory Visit (HOSPITAL_COMMUNITY): Payer: Self-pay | Admitting: Obstetrics and Gynecology

## 2017-03-14 DIAGNOSIS — N760 Acute vaginitis: Principal | ICD-10-CM

## 2017-03-14 DIAGNOSIS — B9689 Other specified bacterial agents as the cause of diseases classified elsewhere: Secondary | ICD-10-CM

## 2017-03-16 ENCOUNTER — Other Ambulatory Visit: Payer: Self-pay | Admitting: Obstetrics and Gynecology

## 2017-03-16 DIAGNOSIS — B9689 Other specified bacterial agents as the cause of diseases classified elsewhere: Secondary | ICD-10-CM

## 2017-03-16 DIAGNOSIS — N760 Acute vaginitis: Principal | ICD-10-CM

## 2017-03-16 MED ORDER — METRONIDAZOLE 500 MG PO TABS: 500.0000 mg | ORAL_TABLET | Freq: Two times a day (BID) | ORAL | 0 refills | Status: DC

## 2017-03-21 ENCOUNTER — Other Ambulatory Visit (HOSPITAL_COMMUNITY): Payer: Self-pay | Admitting: *Deleted

## 2017-03-21 DIAGNOSIS — N760 Acute vaginitis: Principal | ICD-10-CM

## 2017-03-21 DIAGNOSIS — B9689 Other specified bacterial agents as the cause of diseases classified elsewhere: Secondary | ICD-10-CM

## 2017-03-21 MED ORDER — METRONIDAZOLE 500 MG PO TABS: 500.0000 mg | ORAL_TABLET | Freq: Two times a day (BID) | ORAL | 0 refills | Status: DC

## 2018-03-21 ENCOUNTER — Ambulatory Visit (HOSPITAL_COMMUNITY): Payer: No Typology Code available for payment source

## 2018-06-22 ENCOUNTER — Encounter (HOSPITAL_COMMUNITY): Payer: Self-pay

## 2018-06-22 ENCOUNTER — Ambulatory Visit (HOSPITAL_COMMUNITY)
Admission: RE | Admit: 2018-06-22 | Discharge: 2018-06-22 | Disposition: A | Discharge status: Home / Self Care | Payer: No Typology Code available for payment source | Source: Ambulatory Visit | Attending: Obstetrics and Gynecology | Admitting: Obstetrics and Gynecology

## 2018-06-22 ENCOUNTER — Encounter (HOSPITAL_COMMUNITY): Payer: Self-pay | Admitting: *Deleted

## 2018-06-22 VITALS — BP 108/68 | Wt 174.0 lb

## 2018-06-22 DIAGNOSIS — Z01419 Encounter for gynecological examination (general) (routine) without abnormal findings: Secondary | ICD-10-CM

## 2018-06-22 NOTE — Progress Notes (Signed)
No complaints today.   Pap Smear: Pap smear completed today. Last Pap smear was 03/03/2017 at [facility] and normal with positive HPV. 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 Per patient has a history of two other abnormal pap Smears. The first Pap smear was in 1999 and a colposcopy and LEEP was completed for follow up. The second abnormal was in 2008 and a colposcopy was completed for follow up. Last two Pap smears and last colposcopy results are in EPIC.  Physical exam: Breasts Breasts symmetrical. No skin abnormalities bilateral breasts. No nipple retraction bilateral breasts. No nipple discharge bilateral breasts. No lymphadenopathy. No lumps palpated bilateral breasts. No complaints of pain or tenderness on exam. Screening mammogram recommended at age 39 unless clinically indicated prior.     Pelvic/Bimanual   Ext Genitalia No lesions, no swelling and no discharge observed on external genitalia.         Vagina Vagina pink and normal texture. No lesions or discharge observed in vagina.          Cervix Cervix is present. Cervix pink and of normal texture. No discharge observed.     Uterus Uterus is present and palpable. Uterus in normal position and normal size.        Adnexae Bilateral ovaries present and palpable. No tenderness on palpation.         Rectovaginal No rectal exam completed today since patient had no rectal complaints. No skin abnormalities observed on exam.    Smoking History: Patient is a current smoker. Discussed smoking cessation with patient. Referred patient to the [facility] Quitline and gave resources to the free smoking cessation classes at [facility].  Patient Navigation: Patient education provided. Access to services provided for patient through [facility] program.   Breast and Cervical Cancer Risk Assessment: Patient has no  family history of breast cancer, known genetic mutations, or radiation treatment to the chest before age 60. Patient has a history of cervical dysplasia. Patient has no history of being immunocompromised or DES exposure in-utero.  Risk Assessment    Risk Scores      06/22/2018   Last edited by: Armond Hang, LPN   5-year risk: 0.5 %   Lifetime risk: 8.3 %

## 2018-06-22 NOTE — Patient Instructions (Signed)
Explained breast self awareness with Felecia Jan. Let patient know if today's Pap smear is normal that her next one is due in one year. Let patient know will follow up with her within the next couple weeks with results of Pap smear by letter or phone. Let patient know a screening mammogram is recommended at age 39 unless clinically indicated prior. Discussed smoking cessation with patient. Referred patient to the [facility] Quitline and gave resources to the free smoking cessation classes at [facility]. Felecia Jan verbalized understanding.  [place] General, Arvil Chaco, RN 8:46 AM

## 2018-06-22 NOTE — Addendum Note (Signed)
Encounter addended by: Armond Hang, LPN on: 3/81/7711 6:57 AM  Actions taken: Order list changed

## 2018-06-27 LAB — CYTOLOGY - PAP: HPV (WINDOPATH): DETECTED — AB

## 2018-06-28 ENCOUNTER — Telehealth: Payer: Self-pay | Admitting: Nurse Practitioner

## 2018-06-28 NOTE — Telephone Encounter (Signed)
Called the patient to inform of upcoming appointment. No options to leave a voicemail as the mailbox is full. Sending the patient a reminder letter.

## 2018-06-29 ENCOUNTER — Telehealth (HOSPITAL_COMMUNITY): Payer: Self-pay | Admitting: *Deleted

## 2018-06-29 NOTE — Telephone Encounter (Signed)
Telephoned patient at home number and advised patient of abnormal pap smear results. Advised patient would need colposcopy. Patient voiced understanding. Patient is scheduled at [facility] on Feb 5 2:15.

## 2018-07-12 ENCOUNTER — Ambulatory Visit: Payer: No Typology Code available for payment source | Admitting: Obstetrics and Gynecology

## 2018-07-20 ENCOUNTER — Encounter: Payer: Self-pay | Admitting: Obstetrics and Gynecology

## 2018-07-20 ENCOUNTER — Ambulatory Visit (INDEPENDENT_AMBULATORY_CARE_PROVIDER_SITE_OTHER): Payer: Self-pay | Admitting: Obstetrics and Gynecology

## 2018-07-20 ENCOUNTER — Other Ambulatory Visit (HOSPITAL_COMMUNITY)
Admission: RE | Admit: 2018-07-20 | Discharge: 2018-07-20 | Disposition: A | Discharge status: Home / Self Care | Payer: Self-pay | Source: Ambulatory Visit | Attending: Obstetrics and Gynecology | Admitting: Obstetrics and Gynecology

## 2018-07-20 DIAGNOSIS — R87611 Atypical squamous cells cannot exclude high grade squamous intraepithelial lesion on cytologic smear of cervix (ASC-H): Secondary | ICD-10-CM | POA: Insufficient documentation

## 2018-07-20 LAB — POCT PREGNANCY, URINE: Preg Test, Ur: NEGATIVE

## 2018-07-20 NOTE — Progress Notes (Signed)
    GYNECOLOGY CLINIC COLPOSCOPY PROCEDURE NOTE  38 y.o. [id number] here for colposcopy for ASC cannot exclude high grade lesion [facility]) pap smear on 06/2018. Discussed role for HPV in cervical dysplasia, need for surveillance. H/O LGSIL with confirmation by colpo Bx in 2017.  Patient given informed consent, signed copy in the chart, time out was performed.  Placed in lithotomy position. Cervix viewed with speculum and colposcope after application of acetic acid.   Colposcopy adequate? Yes  acetowhite lesion(s) noted at 6 o'clock; corresponding biopsies obtained.  ECC specimen obtained. All specimens were labelled and sent to pathology. Monsel's applied for hemostasis   Patient was given post procedure instructions.  Will follow up pathology and manage accordingly.  Routine preventative health maintenance measures emphasized.    Arlina Robes, MD, [place] Attending [place] for [employer]

## 2018-07-20 NOTE — Patient Instructions (Signed)
Colposcopy, Care After  This sheet gives you information about how to care for yourself after your procedure. Your doctor may also give you more specific instructions. If you have problems or questions, contact your doctor.  What can I expect after the procedure?  If you did not have a tissue sample removed (did not have a biopsy), you may only have some spotting for a few days. You can go back to your normal activities.  If you had a tissue sample removed, it is common to have:   Soreness and pain. This may last for a few days.   Light-headedness.   Mild bleeding from your vagina or dark-colored, grainy discharge from your vagina. This may last for a few days. You may need to wear a sanitary pad.   Spotting for at least 48 hours after the procedure.  Follow these instructions at home:     Take over-the-counter and prescription medicines only as told by your doctor. Ask your doctor what medicines you can start taking again. This is very important if you take blood-thinning medicine.   Do not drive or use heavy machinery while taking prescription pain medicine.   For 3 days, or as long as your doctor tells you, avoid:  ? Douching.  ? Using tampons.  ? Having sex.   If you use birth control (contraception), keep using it.   Limit activity for the first day after the procedure. Ask your doctor what activities are safe for you.   It is up to you to get the results of your procedure. Ask your doctor when your results will be ready.   Keep all follow-up visits as told by your doctor. This is important.  Contact a doctor if:   You get a skin rash.  Get help right away if:   You are bleeding a lot from your vagina. It is a lot of bleeding if you are using more than one pad an hour for 2 hours in a row.   You have clumps of blood (blood clots) coming from your vagina.   You have a fever.   You have chills   You have pain in your lower belly (pelvic area).   You have signs of infection, such as vaginal  discharge that is:  ? Different than usual.  ? Yellow.  ? Bad-smelling.   You have very pain or cramps in your lower belly that do not get better with medicine.   You feel light-headed.   You feel dizzy.   You pass out (faint).  Summary   If you did not have a tissue sample removed (did not have a biopsy), you may only have some spotting for a few days. You can go back to your normal activities.   If you had a tissue sample removed, it is common to have mild pain and spotting for 48 hours.   For 3 days, or as long as your doctor tells you, avoid douching, using tampons and having sex.   Get help right away if you have bleeding, very bad pain, or signs of infection.  This information is not intended to replace advice given to you by your health care provider. Make sure you discuss any questions you have with your health care provider.  Document Released: 11/10/2007 Document Revised: 02/11/2016 Document Reviewed: 02/11/2016  Elsevier Interactive Patient Education  2019 Elsevier Inc.

## 2018-07-25 ENCOUNTER — Encounter: Payer: Self-pay | Admitting: *Deleted

## 2018-07-26 ENCOUNTER — Telehealth: Payer: Self-pay

## 2018-07-26 NOTE — Telephone Encounter (Signed)
Called pt with test results explained BX's negative & to get another Pap w/ HPV co testing in 1 Yr. Pt verbalized understanding.

## 2018-07-26 NOTE — Telephone Encounter (Signed)
-----   Message from Chancy Milroy, MD sent at 07/26/2018 10:49 AM EST ----- Please let Ms Botto know that her BX's were negative. Recommend repeat pap smear with HPV co testing in 1 yr. Thanks Legrand Como

## 2018-09-13 ENCOUNTER — Encounter: Payer: Self-pay | Admitting: *Deleted

## 2019-11-07 ENCOUNTER — Other Ambulatory Visit: Payer: Self-pay

## 2019-11-07 DIAGNOSIS — Z1231 Encounter for screening mammogram for malignant neoplasm of breast: Secondary | ICD-10-CM

## 2019-11-20 ENCOUNTER — Ambulatory Visit
Admission: RE | Admit: 2019-11-20 | Discharge: 2019-11-20 | Disposition: A | Discharge status: Home / Self Care | Payer: No Typology Code available for payment source | Source: Ambulatory Visit | Attending: Obstetrics and Gynecology | Admitting: Obstetrics and Gynecology

## 2019-11-20 ENCOUNTER — Other Ambulatory Visit: Payer: Self-pay

## 2019-11-20 ENCOUNTER — Ambulatory Visit: Payer: Self-pay | Admitting: *Deleted

## 2019-11-20 ENCOUNTER — Ambulatory Visit: Payer: Medicaid Other

## 2019-11-20 VITALS — BP 126/74 | Temp 98.2°F | Wt 161.7 lb

## 2019-11-20 DIAGNOSIS — Z124 Encounter for screening for malignant neoplasm of cervix: Secondary | ICD-10-CM

## 2019-11-20 DIAGNOSIS — Z01419 Encounter for gynecological examination (general) (routine) without abnormal findings: Secondary | ICD-10-CM

## 2019-11-20 DIAGNOSIS — Z1231 Encounter for screening mammogram for malignant neoplasm of breast: Secondary | ICD-10-CM

## 2019-11-20 NOTE — Progress Notes (Signed)
Tammy [facility] is a 39 y.o. [id number] female who presents to [facility] today with no complaints. She is here for a breast exam and 1 year follow up Pap smear following an abnormal Pap smear in January 2020.    Pap Smear: Pap smear completed today. Last Pap smear was 06/22/2018 at [facility] and was abnormal. Per patient has history of an abnormal Pap smear. Last Pap smear result is available in Epic. She expresses interest in Tammy Gardisil vaccine.   Physical exam: Breasts Breasts symmetrical. No skin abnormalities bilateral breasts. No nipple retraction bilateral breasts. No nipple discharge bilateral breasts. No lymphadenopathy. No lumps palpated bilateral breasts. She has a history of a benign lumpectomy in 2017.   Pelvic/Bimanual Ext Genitalia No lesions, no swelling and no discharge observed on external genitalia.        Vagina Vagina pink and normal texture. No lesions or discharge observed in vagina.        Cervix Cervix is present. Cervix pink and of normal texture. No discharge observed.    Uterus Uterus is present and palpable. Uterus in normal position and normal size.        Adnexae Bilateral ovaries present and palpable. No tenderness on palpation.         Rectovaginal No rectal exam completed today since patient had no rectal complaints. No skin abnormalities observed on exam.     Smoking History: Patient is a current smoker and was referred to quit line. She is not ready to quit today.   Patient Navigation: Patient education provided. Access to services provided for patient through [facility] program.    Breast and Cervical Cancer Risk Assessment: Patient does not have a family history of breast cancer, known genetic mutations, or radiation treatment to Tammy chest before age 82. Patient does not have a history of cervical dysplasia, immunocompromised, or DES exposure in-utero.  Risk Assessment    Risk Scores      11/20/2019 06/22/2018   Last edited by: Loletta Parish, RN Armond Hang, LPN   5-year risk: 0.6 % 0.5 %   Lifetime risk: 8.2 % 8.3 %          A: Tammy exam with pap smear No complaints today.  P: Referred patient to [facility] for a screening mammogram. Appointment scheduled 11/20/2019 at 1045.  Vania Rea, RN, FNP student 11/20/2019 1:31 PM   Attestation of Supervision of Student:  I confirm that I have verified Tammy information documented in Tammy nurse practitioner student's note and that I have also personally reperformed Tammy history, physical exam and all medical decision making activities.  I have verified that all services and findings are accurately documented in this student's note; and I agree with management and plan as outlined in Tammy documentation. I have also made any necessary editorial changes.  Pap Smear: Pap smear completed today. Last Pap smear was 06/22/2018 at [facility] and [facility] and HPV positive. Patient had a colposcopy to follow-up 07/20/2018 that was benign. Patients previous Pap smear was  03/03/2017 at [facility] and normal with positive HPV. 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 Tammy first Pap smear was in 1999 and a colposcopy and LEEP was completed for follow up. Tammy second abnormal was in 2008 and a colposcopy  was completed for follow up.Last three Pap smears and last two colposcopy results are in EPIC.  Brannock, [place] for [employer], [employer] 11/20/2019 3:49 PM

## 2019-11-20 NOTE — Patient Instructions (Addendum)
Explained breast self awareness with Felecia Jan. Patient did receive a Pap smear today. Last Pap smear was in January 2020 and was abnormal. Patient was told then to repeat Pap in one year. Let patient know that next Pap smear will be due based on the result of today's Pap smear. Referred patient to the [facility] for a screening mammogram. Appointment scheduled 11/20/2019 at 1045. Patient aware of appointment and will be there. Informed patient that we will follow-up with her within the next couple of weeks with results of her Pap smear by letter or phone. Let [facility] know [facility] will [facility] up with her within the next couple weeks with results of her mammogram by letter or phone. Felecia Jan verbalized understanding.  Vania Rea, RN, FNP student 2:48 PM   Attestation of Supervision of Student:  I confirm that I have verified the information documented in the nurse practitioner student's note and that I have also personally reperformed the history, physical exam and all medical decision making activities.  I have verified that all services and findings are accurately documented in this student's note; and I agree with management and plan as outlined in the documentation. I have also made any necessary editorial changes.  Brannock, [place] for [employer], [employer] 11/20/2019 3:49 PM

## 2019-11-21 LAB — CYTOLOGY - PAP
Comment: NEGATIVE
Diagnosis: NEGATIVE
High risk HPV: NEGATIVE

## 2019-11-21 NOTE — Progress Notes (Signed)
Does this patient need to repeat pap within 1 year or 5 years. HX LSIL

## 2019-11-22 ENCOUNTER — Telehealth: Payer: Self-pay

## 2019-11-22 NOTE — Telephone Encounter (Signed)
Attempted to contact patient regarding lab results, received message "mailbox full, cannot take any more messages".

## 2019-11-23 ENCOUNTER — Telehealth: Payer: Self-pay

## 2019-11-23 NOTE — Telephone Encounter (Signed)
Per Dr. Jenne Campus, Patient informed negative Pap and HPV results, repeat in 1 year, if negative, can do next pap in 5 years. Patient states prior to this call, she received a letter via MyChart from the [facility] stating she needed further imaging possible Right breast mass, Possible left breast asymmetry, needs know if she needs to schedule follow-up (diagnostic) mammogram. Patient informed the importance of follow-up mammogram, does not need to contact us (B[facility]CP), schedule directly with the Breast Center.  Patient verbalized understanding, states she would call today to schedule, verified the telephone number for the Breast Center.

## 2019-11-26 ENCOUNTER — Other Ambulatory Visit: Payer: Self-pay | Admitting: Obstetrics and Gynecology

## 2019-11-26 DIAGNOSIS — R928 Other abnormal and inconclusive findings on diagnostic imaging of breast: Secondary | ICD-10-CM

## 2019-12-06 ENCOUNTER — Other Ambulatory Visit: Payer: Self-pay | Admitting: Obstetrics and Gynecology

## 2019-12-06 ENCOUNTER — Ambulatory Visit
Admission: RE | Admit: 2019-12-06 | Discharge: 2019-12-06 | Disposition: A | Discharge status: Home / Self Care | Payer: No Typology Code available for payment source | Source: Ambulatory Visit | Attending: Obstetrics and Gynecology | Admitting: Obstetrics and Gynecology

## 2019-12-06 ENCOUNTER — Other Ambulatory Visit: Payer: Self-pay

## 2019-12-06 DIAGNOSIS — N6310 Unspecified lump in the right breast, unspecified quadrant: Secondary | ICD-10-CM

## 2019-12-06 DIAGNOSIS — R928 Other abnormal and inconclusive findings on diagnostic imaging of breast: Secondary | ICD-10-CM

## 2019-12-13 ENCOUNTER — Ambulatory Visit
Admission: RE | Admit: 2019-12-13 | Discharge: 2019-12-13 | Disposition: A | Discharge status: Home / Self Care | Payer: No Typology Code available for payment source | Source: Ambulatory Visit | Attending: Obstetrics and Gynecology | Admitting: Obstetrics and Gynecology

## 2019-12-13 ENCOUNTER — Other Ambulatory Visit: Payer: Self-pay | Admitting: Obstetrics and Gynecology

## 2019-12-13 ENCOUNTER — Other Ambulatory Visit: Payer: Self-pay

## 2019-12-13 DIAGNOSIS — N6310 Unspecified lump in the right breast, unspecified quadrant: Secondary | ICD-10-CM

## 2019-12-19 ENCOUNTER — Other Ambulatory Visit: Payer: Self-pay

## 2019-12-19 ENCOUNTER — Ambulatory Visit
Admission: RE | Admit: 2019-12-19 | Discharge: 2019-12-19 | Disposition: A | Discharge status: Home / Self Care | Payer: No Typology Code available for payment source | Source: Ambulatory Visit | Attending: Obstetrics and Gynecology | Admitting: Obstetrics and Gynecology

## 2019-12-19 DIAGNOSIS — N6310 Unspecified lump in the right breast, unspecified quadrant: Secondary | ICD-10-CM

## 2019-12-19 HISTORY — PX: BREAST BIOPSY: SHX20

## 2020-01-21 ENCOUNTER — Other Ambulatory Visit: Payer: Self-pay | Admitting: Obstetrics and Gynecology

## 2020-01-21 ENCOUNTER — Encounter (INDEPENDENT_AMBULATORY_CARE_PROVIDER_SITE_OTHER): Payer: Self-pay

## 2020-01-21 ENCOUNTER — Other Ambulatory Visit: Payer: Self-pay | Admitting: *Deleted

## 2020-01-21 DIAGNOSIS — N6310 Unspecified lump in the right breast, unspecified quadrant: Secondary | ICD-10-CM

## 2020-01-22 ENCOUNTER — Ambulatory Visit: Payer: Self-pay | Admitting: *Deleted

## 2020-01-22 ENCOUNTER — Other Ambulatory Visit: Payer: Self-pay | Admitting: *Deleted

## 2020-01-22 ENCOUNTER — Other Ambulatory Visit: Payer: Self-pay

## 2020-01-22 VITALS — BP 92/60 | Temp 97.1°F | Wt 154.1 lb

## 2020-01-22 DIAGNOSIS — N6310 Unspecified lump in the right breast, unspecified quadrant: Secondary | ICD-10-CM

## 2020-01-22 DIAGNOSIS — Z1239 Encounter for other screening for malignant neoplasm of breast: Secondary | ICD-10-CM

## 2020-01-22 MED ORDER — CEPHALEXIN 500 MG PO CAPS
500.0000 mg | ORAL_CAPSULE | Freq: Four times a day (QID) | ORAL | 0 refills | Status: AC
Start: 2020-01-22 — End: 2020-01-29

## 2020-01-22 NOTE — Patient Instructions (Addendum)
Informed Oleh Genin about breast self awareness. Patient did not need a Pap smear today due to last Pap smear was on 11/20/2019 per patient. Let her know that her next Pap smear is due in one year due to her history of an abnormal Pap smear. Referred patient to the [facility] for a right breast diagnostic mammogram and ultrasound with possible aspiration. Appointment scheduled January 24, 2020 at 1:45pm. Patient aware of appointment and will be there. Oleh Genin verbalized understanding.  Vania Rea, RN, FNP student 1:14 PM

## 2020-01-22 NOTE — Progress Notes (Signed)
[name] is a 39 y.o. female who presents to [facility] clinic today with complaints of a right breast lump with pain.    Pap Smear: Pap smear not completed today. Last Pap smear was 11/20/2019 at [facility] and was normal with negative HPV. Per patient has a history of abnormal Pap smears. Her most recent abnormal Pap smear was on 06/22/2018 at [facility] clinic that showed ASC-H with positive HPV. This was followed with a colposcopy on 07/20/2018 that was negative and a repeat Pap smear was recommended in 1 year. Last Pap smear result is available in Epic.   Physical exam: Breasts Breasts asymmetrical, R > L. Patient states she has noticed this recently with the recurrence of her right breast abnormalities and biopsies. Skin on right breast is slightly red in the area of the breast lump. No nipple retraction bilateral breasts. No nipple discharge bilateral breasts. No lymphadenopathy. A lump was palpated in the right breast between 9 o'clock and 11 o'clock next to the areola. The lump measures 5 cm long by 4 cm wide and is painful to the touch. Patient states that she first noticed the lump 3 days ago and she describes the pain as a 7-8/10 in severity but increases to a 10/10 with touch.       Pelvic/Bimanual Pap is not indicated today.    Smoking History: Patient is a current smoker. She was referred to the [facility] Quit line.    Patient Navigation: Patient education provided. Access to services provided for patient through BCCCP program.     Breast and Cervical Cancer Risk Assessment: Patient does not have family history of breast cancer, known genetic mutations, or radiation treatment to the chest before age 40. Patient has history of cervical dysplasia. She is not immunocompromised and had no DES exposure in-utero.  Risk Assessment    Risk Scores      01/22/2020 11/20/2019   Last edited by: Tammy Revel, LPN Tammy Buckley, Tammy Gold, RN   5-year risk: 0.6 % 0.6 %   Lifetime risk: 8.2 %  8.2 %          A: BCCCP exam without pap smear Complaints of a right breast lump with pain.  P: Referred patient to the [facility] for a right breast diagnostic mammogram and ultrasound with possible aspiration. Appointment scheduled January 24, 2020 at 1:45pm.  Tammy Rea, RN, FNP student 01/22/2020 1:03 PM   Attestation of Supervision of Student:  I confirm that I have verified the information documented in the nurse practitioner student's note and that I have also personally reperformed the history, physical exam and all medical decision making activities.  I have verified that all services and findings are accurately documented in this student's note; and I agree with management and plan as outlined in the documentation. I have also made any necessary editorial changes.  Tammy Buckley, Tammy Buckley for Tammy Buckley, Tammy Buckley 01/22/2020 1:42 PM

## 2020-01-24 ENCOUNTER — Other Ambulatory Visit: Payer: Self-pay

## 2020-01-24 ENCOUNTER — Ambulatory Visit
Admission: RE | Admit: 2020-01-24 | Discharge: 2020-01-24 | Disposition: A | Discharge status: Home / Self Care | Payer: No Typology Code available for payment source | Source: Ambulatory Visit | Attending: Obstetrics and Gynecology | Admitting: Obstetrics and Gynecology

## 2020-01-24 DIAGNOSIS — N6310 Unspecified lump in the right breast, unspecified quadrant: Secondary | ICD-10-CM

## 2020-10-28 ENCOUNTER — Other Ambulatory Visit: Payer: Self-pay

## 2020-10-28 DIAGNOSIS — Z1231 Encounter for screening mammogram for malignant neoplasm of breast: Secondary | ICD-10-CM

## 2021-02-02 ENCOUNTER — Other Ambulatory Visit: Payer: Self-pay | Admitting: Obstetrics and Gynecology

## 2021-02-02 DIAGNOSIS — Z1231 Encounter for screening mammogram for malignant neoplasm of breast: Secondary | ICD-10-CM

## 2021-02-03 ENCOUNTER — Other Ambulatory Visit: Payer: Self-pay

## 2021-02-03 ENCOUNTER — Ambulatory Visit: Payer: Self-pay | Admitting: *Deleted

## 2021-02-03 ENCOUNTER — Ambulatory Visit
Admission: RE | Admit: 2021-02-03 | Discharge: 2021-02-03 | Disposition: A | Discharge status: Home / Self Care | Payer: No Typology Code available for payment source | Source: Ambulatory Visit | Attending: Obstetrics and Gynecology | Admitting: Obstetrics and Gynecology

## 2021-02-03 VITALS — BP 96/74 | Wt 147.8 lb

## 2021-02-03 DIAGNOSIS — Z01419 Encounter for gynecological examination (general) (routine) without abnormal findings: Secondary | ICD-10-CM

## 2021-02-03 NOTE — Progress Notes (Signed)
Tammy Buckley is a 40 y.o. [id number] female who presents to [facility] clinic today with no complaints.    Pap Smear: Pap smear completed today. Last Pap smear was 11/20/2019 at [facility] and was normal with negative HPV. 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 Per patient has a history of two other abnormal pap Smears in 1999 and 2008 that a colposcopy was completed for follow up both times. Last Pap smear result is available in Epic.  Physical exam: Right breast is slightly larger than left breast that per patient is normal for her. No skin abnormalities bilateral breasts. No nipple retraction bilateral breasts. No nipple discharge bilateral breasts. No lymphadenopathy. No lumps palpated bilateral breasts. No complaints of pain or tenderness on exam.  MS DIGITAL SCREENING TOMO BILATERAL  Result Date: 11/23/2019 CLINICAL DATA:  Screening. EXAM: DIGITAL SCREENING BILATERAL MAMMOGRAM WITH TOMO AND CAD COMPARISON:  Previous exam(s). ACR Breast Density Category c: The breast tissue is heterogeneously dense, which may obscure small masses. FINDINGS: In the right breast a possible mass requires further evaluation. In the left breast and possible asymmetry requires further evaluation. Images were processed with CAD. IMPRESSION: Further evaluation is suggested for possible mass in the right breast. Further evaluation is suggested for possible asymmetry in the left breast. RECOMMENDATION: Diagnostic mammogram and possibly ultrasound of both breasts. (Code:[id number]) The patient will be contacted regarding the findings, and additional imaging will be scheduled. BI-RADS CATEGORY  0: Incomplete. Need additional imaging evaluation and/or prior mammograms for comparison.  Electronically Signed   By: Nolon Nations M.D.   On: 11/23/2019 13:06   MS DIGITAL DIAG TOMO BILAT  Result Date: 12/06/2019 CLINICAL DATA:  Screening recall for possible right breast mass. EXAM: DIGITAL DIAGNOSTIC BILATERAL MAMMOGRAM WITH TOMO AND CAD; ULTRASOUND RIGHT BREAST LIMITED RIGHT BREAST ULTRASOUND COMPARISON:  Previous exams. ACR Breast Density Category c: The breast tissue is heterogeneously dense, which may obscure small masses. FINDINGS: Spot compression tomograms were performed of the right breast confirming the presence of a persistent mass/asymmetry in the retroareolar right breast measuring approximately 0.9 cm. Mammographic images were processed with CAD. Targeted ultrasound of the entire central right breast was performed. Several small areas of fibrocystic change are identified in the central to upper right breast. There is complex mixed cystic and solid mass in the right breast at 12 o'clock retroareolar measuring 0.6 x 0.5 x 0.5 cm, possibly intraductal. This mass with associated fibrocystic change likely accounts for the mass/asymmetry seen in the retroareolar right breast. No lymphadenopathy seen in the right axilla. IMPRESSION: Indeterminate 0.6 cm mass in the right breast at 12 o'clock retroareolar, possibly intraductal. RECOMMENDATION: Ultrasound-guided biopsy of the mass in the right breast is recommended. This will be scheduled for the patient. I have discussed the findings and recommendations with the patient. If applicable, a reminder letter will be sent to the patient regarding the next appointment. BI-RADS CATEGORY  4: Suspicious. Electronically Signed   By: Everlean Alstrom M.D.   On: 12/06/2019 11:25   MM CLIP PLACEMENT RIGHT  Result Date: 12/19/2019 CLINICAL DATA:  Post stereotactic guided biopsy of a mass in the upper central posterior right breast at the 12 o'clock position. EXAM: DIAGNOSTIC RIGHT MAMMOGRAM POST STEREOTACTIC BIOPSY COMPARISON:  Previous exams.  FINDINGS: Mammographic images  were obtained following stereotactic guided biopsy of a mass in the right breast in the upper central posterior right breast. A coil shaped biopsy marking clip is present at the site of the biopsied mass in the upper central posterior right breast. A small post biopsy hematoma is present. IMPRESSION: Coil shaped biopsy marking clip at site of biopsied mass in the central upper posterior right breast. Final Assessment: Post Procedure Mammograms for Marker Placement Electronically Signed   By: Everlean Alstrom M.D.   On: 12/19/2019 10:17   MM CLIP PLACEMENT RIGHT  Result Date: 12/13/2019 CLINICAL DATA:  Patient status post ultrasound-guided core needle biopsy right breast mass 12 o'clock position. EXAM: DIAGNOSTIC RIGHT MAMMOGRAM POST ULTRASOUND BIOPSY COMPARISON:  Previous exam(s). FINDINGS: Mammographic images were obtained following ultrasound guided biopsy of right breast mass 12 o'clock position. The biopsy marking clip is located at the site of biopsied mass. The mass that was biopsied does not correspond with the mammographically identified mass. IMPRESSION: The ribbon shaped marking clip is located in appropriate position within the mass that was biopsied with ultrasound 12 o'clock position retroareolar location. This mass does not correspond with the originally identified mass in the right breast on screening mammogram. Therefore, a stereotactic guided core needle biopsy of the originally evaluated mass within the right breast will be scheduled and performed. This was discussed with the patient. She is agreeable. Final Assessment: Post Procedure Mammograms for Marker Placement Electronically Signed   By: Lovey Newcomer M.D.   On: 12/13/2019 08:40   MM RT BREAST BX W LOC DEV 1ST LESION IMAGE BX SPEC STEREO GUIDE  Addendum Date: 12/20/2019   ADDENDUM REPORT: 12/20/2019 09:53 ADDENDUM: Pathology revealed FIBROCYSTIC CHANGE WITH APOCRINE METAPLASIA AND CALCIFICATIONS of the Right  breast, 12 o'clock. This was found to be concordant by Dr. Everlean Alstrom. Pathology results were discussed with the patient by telephone. The patient reported doing well after the biopsy with tenderness at the site. Post biopsy instructions and care were reviewed and questions were answered. The patient was encouraged to call The [facility] for any additional concerns. The patient was instructed to return for annual screening mammography and informed a reminder notice would be sent regarding this appointment. Pathology results reported by Terie Purser, RN on 12/20/2019. Electronically Signed   By: Everlean Alstrom M.D.   On: 12/20/2019 09:53   Result Date: 12/20/2019 CLINICAL DATA:  40 year old female with an indeterminate mass seen in the central upper posterior right breast at the 12 o'clock position on mammography only. EXAM: RIGHT BREAST STEREOTACTIC CORE NEEDLE BIOPSY COMPARISON:  Previous exams. FINDINGS: The patient and I discussed the procedure of stereotactic-guided biopsy including benefits and alternatives. We discussed the high likelihood of a successful procedure. We discussed the risks of the procedure including infection, bleeding, tissue injury, clip migration, and inadequate sampling. Informed written consent was given. The usual time out protocol was performed immediately prior to the procedure. Using sterile technique and 1% Lidocaine as local anesthetic, under stereotactic guidance, a 9 gauge vacuum assisted device was used to perform core needle biopsy of the mass in the central upper posterior right breast using a superior to inferior approach. Lesion quadrant: Upper inner At the conclusion of the procedure, a coil shaped tissue marker clip was deployed into the biopsy cavity. Follow-up 2-view mammogram was performed and dictated separately. IMPRESSION: Stereotactic-guided biopsy of the mass in the central upper posterior right breast/12 o'clock position. No apparent  complications. Electronically Signed: By: Anderson Malta  Shelly Bombard M.D. On: 12/19/2019 10:10        Pelvic/Bimanual Ext Genitalia No lesions, no swelling and no discharge observed on external genitalia.        Vagina Vagina pink and normal texture. No lesions and blood observed in vagina consistent with the end of patients menstrual period.        Cervix Cervix is present. Cervix pink and of normal texture. Menstrual blood observed on cervical os.   Uterus Uterus is present and palpable. Uterus in normal position and normal size.        Adnexae Bilateral ovaries present and palpable. No tenderness on palpation.         Rectovaginal No rectal exam completed today since patient had no rectal complaints. No skin abnormalities observed on exam.     Smoking History: Patient is a current smoker. Discussed smoking cessation with patient. Referred patient to the [facility] Quitline and gave resources to the free smoking cessation classes at [facility].  Patient Navigation: Patient education provided. Access to services provided for patient through BCCCP program.    Breast and Cervical Cancer Risk Assessment: Patient has no family history of breast cancer, known genetic mutations, or radiation treatment to the chest before age 3. Patient has a history of cervical dysplasia. Patient has no history of being immunocompromised or DES exposure in-utero.  Risk Assessment     Risk Scores       02/03/2021 01/22/2020   Last edited by: Royston Bake, CMA McGill, Sherie Mamie Nick, LPN   5-year risk: 0.6 % 0.6 %   Lifetime risk: 8.1 % 8.2 %            A: BCCCP exam with pap smear No complaints.  P: Referred patient to the [facility] for a screening mammogram on mobile unit. Appointment scheduled Tuesday, February 03, 2021 at 1400.  Loletta Parish, RN 02/03/2021 4:02 PM

## 2021-02-03 NOTE — Patient Instructions (Signed)
Explained breast self awareness with Oleh Genin. Pap smear completed today. Let patient know that her next Pap smear will be due based on the result of today's Pap smear. Referred patient to the [facility] for a screening mammogram on mobile unit. Appointment scheduled Tuesday, February 03, 2021 at 1400. Patient escorted to the mobile unit following BCCCP appointment for her screening mammogram. Let patient know will follow up with her within the next couple weeks with results of her Pap smear by phone. Informed patient that the Breast Center will follow-up with her within the next couple of weeks with results of her mammogram by letter or phone. Discussed smoking cessation with patient. Referred patient to the [facility] Quitline and gave resources to the free smoking cessation classes at [facility]. Oleh Genin verbalized understanding.  Aqib Lough, Arvil Chaco, RN 4:02 PM

## 2021-02-04 LAB — CYTOLOGY - PAP
Comment: NEGATIVE
Diagnosis: NEGATIVE
Diagnosis: REACTIVE
High risk HPV: NEGATIVE

## 2021-02-05 ENCOUNTER — Telehealth: Payer: Self-pay

## 2021-02-05 NOTE — Telephone Encounter (Signed)
Patient informed negative Pap/HPV results, needs repeat pap in 1 year, if negative within 1 year, may be able to space 3 years between next pap. Patient verbalized understanding.

## 2021-02-10 ENCOUNTER — Other Ambulatory Visit: Payer: Self-pay | Admitting: Obstetrics and Gynecology

## 2021-02-10 DIAGNOSIS — R928 Other abnormal and inconclusive findings on diagnostic imaging of breast: Secondary | ICD-10-CM

## 2021-02-11 ENCOUNTER — Other Ambulatory Visit: Payer: Self-pay | Admitting: Obstetrics and Gynecology

## 2021-02-11 DIAGNOSIS — R928 Other abnormal and inconclusive findings on diagnostic imaging of breast: Secondary | ICD-10-CM

## 2021-03-30 ENCOUNTER — Ambulatory Visit
Admission: RE | Admit: 2021-03-30 | Discharge: 2021-03-30 | Disposition: A | Discharge status: Home / Self Care | Payer: No Typology Code available for payment source | Source: Ambulatory Visit | Attending: Obstetrics and Gynecology | Admitting: Obstetrics and Gynecology

## 2021-03-30 ENCOUNTER — Other Ambulatory Visit: Payer: Self-pay | Admitting: Obstetrics and Gynecology

## 2021-03-30 ENCOUNTER — Other Ambulatory Visit: Payer: Self-pay

## 2021-03-30 DIAGNOSIS — D242 Benign neoplasm of left breast: Secondary | ICD-10-CM

## 2021-03-30 DIAGNOSIS — R928 Other abnormal and inconclusive findings on diagnostic imaging of breast: Secondary | ICD-10-CM

## 2021-09-30 ENCOUNTER — Other Ambulatory Visit: Payer: BC Managed Care – PPO

## 2021-11-23 ENCOUNTER — Other Ambulatory Visit: Payer: Self-pay | Admitting: Obstetrics and Gynecology

## 2021-11-27 ENCOUNTER — Ambulatory Visit
Admission: RE | Admit: 2021-11-27 | Discharge: 2021-11-27 | Disposition: A | Discharge status: Home / Self Care | Payer: No Typology Code available for payment source | Source: Ambulatory Visit | Attending: Obstetrics and Gynecology | Admitting: Obstetrics and Gynecology

## 2021-11-27 ENCOUNTER — Ambulatory Visit
Admission: RE | Admit: 2021-11-27 | Discharge: 2021-11-27 | Disposition: A | Discharge status: Home / Self Care | Payer: BC Managed Care – PPO | Source: Ambulatory Visit | Attending: Obstetrics and Gynecology | Admitting: Obstetrics and Gynecology

## 2021-11-27 ENCOUNTER — Other Ambulatory Visit: Payer: Self-pay | Admitting: Obstetrics and Gynecology

## 2021-11-27 DIAGNOSIS — D242 Benign neoplasm of left breast: Secondary | ICD-10-CM

## 2022-02-11 ENCOUNTER — Other Ambulatory Visit: Payer: Self-pay | Admitting: Family Medicine

## 2022-02-11 ENCOUNTER — Other Ambulatory Visit: Payer: Self-pay | Admitting: Obstetrics and Gynecology

## 2022-02-11 DIAGNOSIS — N6310 Unspecified lump in the right breast, unspecified quadrant: Secondary | ICD-10-CM

## 2022-02-11 DIAGNOSIS — N644 Mastodynia: Secondary | ICD-10-CM

## 2022-02-11 DIAGNOSIS — N6320 Unspecified lump in the left breast, unspecified quadrant: Secondary | ICD-10-CM

## 2022-02-18 ENCOUNTER — Other Ambulatory Visit: Payer: Self-pay

## 2022-02-18 ENCOUNTER — Ambulatory Visit
Admission: RE | Admit: 2022-02-18 | Discharge: 2022-02-18 | Disposition: A | Discharge status: Home / Self Care | Payer: BC Managed Care – PPO | Source: Ambulatory Visit | Attending: Internal Medicine | Admitting: Internal Medicine

## 2022-02-18 VITALS — BP 112/64 | HR 93 | Temp 98.7°F | Resp 18

## 2022-02-18 DIAGNOSIS — J069 Acute upper respiratory infection, unspecified: Secondary | ICD-10-CM | POA: Diagnosis present

## 2022-02-18 DIAGNOSIS — N6310 Unspecified lump in the right breast, unspecified quadrant: Secondary | ICD-10-CM | POA: Insufficient documentation

## 2022-02-18 DIAGNOSIS — Z20822 Contact with and (suspected) exposure to covid-19: Secondary | ICD-10-CM | POA: Diagnosis not present

## 2022-02-18 DIAGNOSIS — N6452 Nipple discharge: Secondary | ICD-10-CM | POA: Insufficient documentation

## 2022-02-18 LAB — SARS CORONAVIRUS 2 BY RT PCR: SARS Coronavirus 2 by RT PCR: NEGATIVE

## 2022-02-18 MED ORDER — FLUTICASONE PROPIONATE 50 MCG/ACT NA SUSP: 1.0000 | Freq: Every day | NASAL | 0 refills | Status: AC

## 2022-02-18 NOTE — ED Provider Notes (Signed)
[name]    CSN: [id number] Arrival date & time: 02/18/22  1648      History   Chief Complaint Chief Complaint  Patient presents with   Nasal Congestion    Sinus infection possibly with migraine dizziness vomiting. Lump and pain in right breast but I have a breast center appt on the 29th of this month. Would like covid test just for verification - Entered by patient    HPI Tammy Buckley is a 41 y.o. female.   Patient presents with nasal congestion, generalized body aches, 2 episodes of nausea with nonbloody emesis that started about 2 to 3 days ago.  Denies any known sick contacts.  Denies any known fevers at home.  Denies chest pain, shortness of breath, sore throat, ear pain, diarrhea, abdominal pain.  Patient has not taken any medications to help alleviate symptoms.  Patient also reports that she has noticed two lumps in the right breast about 3 days ago as well.  Patient reports that she has a history of intermittent lumps in her breast which she is followed by the breast center.  She has scheduled appointment with breast center on 03/05/2022 to evaluate this.  She states that she has had 2 lumpectomies in the right breast multiple years ago which were unremarkable.  She has a lesion in the left breast that that they are monitoring at the breast center as well.  She reports that she has had some green nipple discharge but states that she has had this in the past with lumps as well.  She is not breastfeeding a child. She states she was told in the past that the nipple discharge is benign.      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 Authorizing Provider  fluticasone (FLONASE) 50 MCG/ACT nasal spray Place 1 spray into both nostrils daily for 3 days. 02/18/22 02/21/22 Yes Murlin Schrieber, Michele Rockers, FNP  orphenadrine (NORFLEX) 100 MG tablet Take 100 mg by mouth 2 (two) times daily as needed for muscle spasms. Patient not taking: No sig reported    [provider]    Family History Family  History  Problem Relation Age of Onset   Hypertension Mother    Diabetes Mother    Stroke Mother    Kidney failure Mother    Heart failure Mother    Diabetes Father    Breast cancer Neg Hx     Social History Social History   Tobacco Use   Smoking status: Every Day    Packs/day: 0.10    Types: Cigarettes   Smokeless tobacco: Never   Tobacco comments:    5 cigs per day  Vaping Use   Vaping Use: Never used  Substance Use Topics   Alcohol use: Not Currently    Comment: rare   Drug use: No     Allergies   Oxycodone   Review of Systems Review of Systems Per HPI  Physical Exam Triage Vital Signs ED Triage Vitals  Enc Vitals Group     BP 02/18/22 1716 112/64     Pulse Rate 02/18/22 1716 93     Resp 02/18/22 1716 18     Temp 02/18/22 1716 98.7 F (37.1 C)     Temp Source 02/18/22 1716 Oral     SpO2 02/18/22 1716 99 %     Weight --      Height --      Head Circumference --      Peak Flow --      Pain Score 02/18/22 1718 4     Pain Loc --      Pain Edu? --       Excl. in [place]? --    No data found.  Updated Vital Signs BP 112/64 (BP Location: Left Arm)   Pulse 93   Temp 98.7 F (37.1 C) (Oral)   Resp 18   SpO2 99%   Visual Acuity Right Eye Distance:   Left Eye Distance:   Bilateral Distance:    Right Eye Near:   Left Eye Near:    Bilateral Near:     Physical Exam Exam conducted with a chaperone present.  Constitutional:      General: She is not in acute distress.    Appearance: Normal appearance. She is not toxic-appearing or diaphoretic.  HENT:     Head: Normocephalic and atraumatic.     Right Ear: Tympanic membrane and ear canal normal.     Left Ear: Tympanic membrane and ear canal normal.     Nose: Congestion present.     Mouth/Throat:     Mouth: Mucous membranes are moist.     Pharynx: No posterior oropharyngeal erythema.  Eyes:     Extraocular Movements: Extraocular movements intact.     Conjunctiva/sclera: Conjunctivae normal.     Pupils: Pupils are equal, round, and reactive to light.  Cardiovascular:     Rate and Rhythm: Normal rate and regular rhythm.     Pulses: Normal pulses.     Heart sounds: Normal heart sounds.  Pulmonary:     Effort: Pulmonary effort is normal. No respiratory distress.     Breath sounds: Normal breath sounds. No stridor. No wheezing, rhonchi or rales.  Chest:       Comments: Patient has approximately 1 cm in diameter palpable nonmobile lumps present to circled area on diagram of her right breast.  No erythema, swelling, nipple discharge noted. Abdominal:     General: Abdomen is flat. Bowel sounds are normal.     Palpations: Abdomen is soft.  Musculoskeletal:        General: Normal range of motion.  Cervical back: Normal range of motion.  Skin:    General: Skin is warm and dry.  Neurological:     General: No focal deficit present.     Mental Status: She is alert and oriented to person, place, and time. Mental status is at baseline.  Psychiatric:        Mood and Affect: Mood normal.         Behavior: Behavior normal.      UC Treatments / Results  Labs (all labs ordered are listed, but only abnormal results are displayed) Labs Reviewed  SARS CORONAVIRUS 2 BY RT PCR    EKG   Radiology No results found.  Procedures Procedures (including critical care time)  Medications Ordered in UC Medications - No data to display  Initial Impression / Assessment and Plan / UC Course  I have reviewed the triage vital signs and the nursing notes.  Pertinent labs & imaging results that were available during my care of the patient were reviewed by me and considered in my medical decision making (see chart for details).     Patient presents with symptoms likely from a viral upper respiratory infection. Differential includes bacterial pneumonia, sinusitis, allergic rhinitis, covid 19, flu. Do not suspect underlying cardiopulmonary process. Symptoms seem unlikely related to ACS, CHF or COPD exacerbations, pneumonia, pneumothorax. Patient is nontoxic appearing and not in need of emergent medical intervention.  COVID test pending. Recommended symptom control with over the counter medications.  Patient sent prescriptions. Return if symptoms fail to improve in 1-2 weeks or you develop shortness of breath, chest pain, severe headache. Patient states understanding and is agreeable.  Patient has palpable lumps to right breast.  I do think imaging is next best step.  Patient already has appointment with breast center on 9/29.  No signs of bacterial infection on exam.  Nipple discharge is also most likely benign given patient has history of similar nipple discharge.  Discharged with PCP followup.  Final Clinical Impressions(s) / UC Diagnoses   Final diagnoses:  Viral upper respiratory infection  Mass of right breast, unspecified quadrant  Nipple discharge     Discharge Instructions      It appears that you have a viral upper respiratory infection that should run its course and self  resolve with symptomatic treatment.  I have prescribed you Flonase to alleviate symptoms.  COVID test is pending.  We will call if it is positive.  Keep scheduled appointment with breast center for further evaluation.    ED Prescriptions     Medication Sig Dispense Auth. Provider   fluticasone (FLONASE) 50 MCG/ACT nasal spray Place 1 spray into both nostrils daily for 3 days. 16 g Teodora Medici, [place]      PDMP not reviewed this encounter.   Teodora Medici, [place] 02/18/22 1807

## 2022-02-18 NOTE — Discharge Instructions (Signed)
It appears that you have a viral upper respiratory infection that should run its course and self resolve with symptomatic treatment.  I have prescribed you Flonase to alleviate symptoms.  COVID test is pending.  We will call if it is positive.  Keep scheduled appointment with breast center for further evaluation.

## 2022-02-18 NOTE — ED Triage Notes (Signed)
Pt here for nasal congestion and body aches with vomiting x 2 yesterday

## 2022-03-05 ENCOUNTER — Ambulatory Visit
Admission: RE | Admit: 2022-03-05 | Discharge: 2022-03-05 | Disposition: A | Discharge status: Home / Self Care | Payer: BC Managed Care – PPO | Source: Ambulatory Visit | Attending: Family Medicine | Admitting: Family Medicine

## 2022-03-05 DIAGNOSIS — N6310 Unspecified lump in the right breast, unspecified quadrant: Secondary | ICD-10-CM

## 2022-03-05 DIAGNOSIS — N6320 Unspecified lump in the left breast, unspecified quadrant: Secondary | ICD-10-CM

## 2022-03-05 DIAGNOSIS — N644 Mastodynia: Secondary | ICD-10-CM

## 2022-11-12 ENCOUNTER — Other Ambulatory Visit (HOSPITAL_COMMUNITY): Payer: Self-pay

## 2022-11-12 ENCOUNTER — Telehealth: Payer: Self-pay

## 2022-11-12 NOTE — Telephone Encounter (Signed)
[facility] Encounter  Insurance verification completed.    The patient is insured through [employer].  Medication will need a PA.  We will continue to follow to see if copay assistance is needed.  Tanyla Stege, CPhT [facility] Phone: [phone] Fax:  [phone]  

## 2022-11-15 ENCOUNTER — Ambulatory Visit: Payer: Medicaid Other | Admitting: Family

## 2023-02-18 ENCOUNTER — Other Ambulatory Visit: Payer: Self-pay

## 2023-02-18 ENCOUNTER — Emergency Department (HOSPITAL_COMMUNITY): Payer: MEDICAID

## 2023-02-18 ENCOUNTER — Encounter (HOSPITAL_COMMUNITY): Payer: Self-pay | Admitting: *Deleted

## 2023-02-18 ENCOUNTER — Emergency Department (HOSPITAL_COMMUNITY)
Admission: EM | Admit: 2023-02-18 | Discharge: 2023-02-18 | Disposition: A | Discharge status: Home / Self Care | Payer: MEDICAID | Attending: Emergency Medicine | Admitting: Emergency Medicine

## 2023-02-18 DIAGNOSIS — R109 Unspecified abdominal pain: Secondary | ICD-10-CM | POA: Insufficient documentation

## 2023-02-18 DIAGNOSIS — Z85528 Personal history of other malignant neoplasm of kidney: Secondary | ICD-10-CM | POA: Insufficient documentation

## 2023-02-18 LAB — URINALYSIS, ROUTINE W REFLEX MICROSCOPIC
Bilirubin Urine: NEGATIVE
Glucose, UA: NEGATIVE mg/dL
Hgb urine dipstick: NEGATIVE
Ketones, ur: NEGATIVE mg/dL
Leukocytes,Ua: NEGATIVE
Nitrite: NEGATIVE
Protein, ur: NEGATIVE mg/dL
Specific Gravity, Urine: 1.016 (ref 1.005–1.030)
pH: 6 (ref 5.0–8.0)

## 2023-02-18 LAB — CBC WITH DIFFERENTIAL/PLATELET
Abs Immature Granulocytes: 0.03 10*3/uL (ref 0.00–0.07)
Basophils Absolute: 0.1 10*3/uL (ref 0.0–0.1)
Basophils Relative: 1 %
Eosinophils Absolute: 0.1 10*3/uL (ref 0.0–0.5)
Eosinophils Relative: 3 %
HCT: 39.8 % (ref 36.0–46.0)
Hemoglobin: 12.9 g/dL (ref 12.0–15.0)
Immature Granulocytes: 1 %
Lymphocytes Relative: 36 %
Lymphs Abs: 2 10*3/uL (ref 0.7–4.0)
MCH: 29.1 pg (ref 26.0–34.0)
MCHC: 32.4 g/dL (ref 30.0–36.0)
MCV: 89.8 fL (ref 80.0–100.0)
Monocytes Absolute: 0.5 10*3/uL (ref 0.1–1.0)
Monocytes Relative: 9 %
Neutro Abs: 2.8 10*3/uL (ref 1.7–7.7)
Neutrophils Relative %: 50 %
Platelets: 191 10*3/uL (ref 150–400)
RBC: 4.43 MIL/uL (ref 3.87–5.11)
RDW: 13.1 % (ref 11.5–15.5)
WBC: 5.6 10*3/uL (ref 4.0–10.5)
nRBC: 0 % (ref 0.0–0.2)

## 2023-02-18 LAB — BASIC METABOLIC PANEL
Anion gap: 7 (ref 5–15)
BUN: 14 mg/dL (ref 6–20)
CO2: 23 mmol/L (ref 22–32)
Calcium: 8.8 mg/dL — ABNORMAL LOW (ref 8.9–10.3)
Chloride: 105 mmol/L (ref 98–111)
Creatinine, Ser: 0.55 mg/dL (ref 0.44–1.00)
GFR, Estimated: >60 mL/min (ref >60)
Glucose, Bld: 94 mg/dL (ref 70–99)
Potassium: 3.7 mmol/L (ref 3.5–5.1)
Sodium: 135 mmol/L (ref 135–145)

## 2023-02-18 LAB — HEPATIC FUNCTION PANEL
ALT: 133 U/L — ABNORMAL HIGH (ref 0–44)
AST: 65 U/L — ABNORMAL HIGH (ref 15–41)
Albumin: 3.9 g/dL (ref 3.5–5.0)
Alkaline Phosphatase: 85 U/L (ref 38–126)
Bilirubin, Direct: <0.1 mg/dL (ref 0.0–0.2)
Total Bilirubin: 0.5 mg/dL (ref 0.3–1.2)
Total Protein: 6.7 g/dL (ref 6.5–8.1)

## 2023-02-18 LAB — HCG, SERUM, QUALITATIVE: Preg, Serum: NEGATIVE

## 2023-02-18 MED ORDER — ONDANSETRON 4 MG PO TBDP
4.0000 mg | ORAL_TABLET | Freq: Once | ORAL | Status: AC
  Administered 2023-02-18: 4 mg via ORAL
  Filled 2023-02-18: qty 1

## 2023-02-18 NOTE — ED Triage Notes (Signed)
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 Pt does have a known kidney stone on rt as well as duplex kidney. Urine has an odor

## 2023-02-18 NOTE — ED Provider Triage Note (Addendum)
Emergency Medicine Provider Triage Evaluation Note  Tammy Buckley , a 42 y.o. female  was evaluated in triage.  Pt complains of bilateral flank pain, dysuria, foul odor in her urine.  Patient is a history of kidney cancer with left partial nephrectomy last year.  She denies any nausea, vomiting, diarrhea, fever, chills.  Review of Systems  Positive:  Negative:   Physical Exam  BP (!) 117/50 (BP Location: Left Arm)   Pulse 80   Temp 98.1 F (36.7 C) (Oral)   Resp 18   Ht 5\' 6"  (1.676 m)   Wt 59 kg   SpO2 99%   BMI 20.98 kg/m  Gen:   Awake, no distress   Resp:  Normal effort  MSK:   Moves extremities without difficulty  Other:  Bilateral flank tenderness   Medical Decision Making  Medically screening exam initiated at 1:27 PM.  Appropriate orders placed.  Sallee Provencal Mccart was informed that the remainder of the evaluation will be completed by another provider, this initial triage assessment does not replace that evaluation, and the importance of remaining in the ED until their evaluation is complete.     Teressa Lower, PA-C 02/18/23 1328    Honor Loh Gary, [place] 02/18/23 1328

## 2023-02-18 NOTE — Discharge Instructions (Signed)
We discussed the results of your CT on today's visit.  I have provided a referral to [facility], please call to schedule an appointment for further evaluation.  We also discussed bowel regimen including MiraLAX along with stool softener such as Dulcolax.  Return to the emergency department if you experience any worsening symptoms.

## 2023-02-18 NOTE — ED Provider Notes (Signed)
[place] EMERGENCY DEPARTMENT AT [facility] Provider Note   CSN: [id number] Arrival date & time: 02/18/23  1250     History Left partial nephrectomy, Chief Complaint  Patient presents with   Flank Pain    Tammy Buckley is a 42 y.o. female.  42 year old female with a past medical history of left partial nephrectomy, right duplex kidney presents to the ED with a chief complaint of bilateral flank pain that is been ongoing for the past 3 days.  She also notes a foul odor to her urine, typically whenever she has a urinary tract infection or a kidney infection.  She has a prior history of kidney CA, reports she had surgery last April and has not follow-up with anyone since this intervention.  She does endorse some nausea but has not taken any medication for improvement in symptoms. She denies any hematuria, vomiting, abdominal pain, no gynecological complaint.   The history is provided by the patient.  Flank Pain This is a new problem. The current episode started more than 2 days ago. The problem occurs constantly. The problem has not changed since onset.Pertinent negatives include no chest pain, no abdominal pain, no headaches and no shortness of breath. The symptoms are aggravated by twisting. Nothing relieves the symptoms. She has tried nothing for the symptoms.       Home Medications Prior to Admission medications   Medication Sig Start Date End Date Taking? Authorizing Provider  fluticasone (FLONASE) 50 MCG/ACT nasal spray Place 1 spray into both nostrils daily for 3 days. 02/18/22 02/21/22  Gustavus Bryant, FNP  orphenadrine (NORFLEX) 100 MG tablet Take 100 mg by mouth 2 (two) times daily as needed for muscle spasms. Patient not taking: No sig reported    [provider]      Allergies    Oxycodone    Review of Systems   Review of Systems  Constitutional:  Negative for chills and fever.  HENT:  Negative for sore throat.   Respiratory:  Negative for  shortness of breath.   Cardiovascular:  Negative for chest pain.  Gastrointestinal:  Positive for nausea. Negative for abdominal pain and vomiting.  Genitourinary:  Positive for flank pain.  Musculoskeletal:  Negative for back pain.  Neurological:  Negative for headaches.  All other systems reviewed and are negative.   Physical Exam Updated Vital Signs BP (!) 103/46 (BP Location: Left Arm)   Pulse 76   Temp 98.2 F (36.8 C) (Oral)   Resp 16   Ht 5\' 6"  (1.676 m)   Wt 59 kg   SpO2 100%   BMI 20.98 kg/m  Physical Exam Vitals and nursing note reviewed.  Constitutional:      Appearance: Normal appearance.  HENT:     Head: Normocephalic and atraumatic.     Mouth/Throat:     Mouth: Mucous membranes are moist.  Eyes:     Pupils: Pupils are equal, round, and reactive to light.  Cardiovascular:     Rate and Rhythm: Normal rate.  Pulmonary:     Effort: Pulmonary effort is normal.  Abdominal:     General: Abdomen is flat.     Palpations: Abdomen is soft.     Tenderness: There is right CVA tenderness and left CVA tenderness.     Comments: Abdomen is soft, BL CVA tenderness.   Musculoskeletal:     Cervical back: Normal range of motion and neck supple.  Skin:    General: Skin is warm and dry.  Neurological:     Mental Status: She is alert and oriented to person, place, and time.     ED Results / Procedures / Treatments   Labs (all labs ordered are listed, but only abnormal results are displayed) Labs Reviewed  BASIC METABOLIC PANEL - Abnormal; Notable for the following components:      Result Value   Calcium 8.8 (*)    All other components within normal limits  HEPATIC FUNCTION PANEL - Abnormal; Notable for the following components:   AST 65 (*)    ALT 133 (*)    All other components within normal limits  URINALYSIS, ROUTINE W REFLEX MICROSCOPIC  HCG, SERUM, QUALITATIVE  CBC WITH DIFFERENTIAL/PLATELET    EKG None  Radiology CT Renal Stone Study  Result Date:  02/18/2023 CLINICAL DATA:  Abdominal and flank pain. EXAM: CT ABDOMEN AND PELVIS WITHOUT CONTRAST TECHNIQUE: Multidetector CT imaging of the abdomen and pelvis was performed following the standard protocol without IV contrast. RADIATION DOSE REDUCTION: This exam was performed according to the departmental dose-optimization program which includes automated exposure control, adjustment of the mA and/or kV according to patient size and/or use of iterative reconstruction technique. COMPARISON:  None Available. FINDINGS: Lower chest: Unremarkable Hepatobiliary: Cholecystectomy.  Otherwise unremarkable. Pancreas: Unremarkable Spleen: Unremarkable Adrenals/Urinary Tract: Both adrenal glands appear normal. No hydronephrosis or hydroureter. Two nonobstructive right renal calculi noted, the larger in the lower pole measuring 0.4 cm. Two nonobstructive left renal calculi are observed, each about 2 mm in diameter. Postoperative findings along the left mid upper kidney anteriorly, probably a site of prior partial nephrectomy. Urinary bladder unremarkable. Stomach/Bowel: Prominent stool throughout the colon favors constipation. Normal appendix. Vascular/Lymphatic: Unremarkable Reproductive: Unremarkable Other: No supplemental non-categorized findings. Musculoskeletal: Unremarkable IMPRESSION: 1. Prominent stool throughout the colon favors constipation. 2. Nonobstructive bilateral nephrolithiasis. 3. Postoperative findings along the left mid upper kidney anteriorly, probably a site of prior partial nephrectomy. Electronically Signed   By: Gaylyn Rong M.D.   On: 02/18/2023 16:54    Procedures Procedures   Medications Ordered in ED Medications  ondansetron (ZOFRAN-ODT) disintegrating tablet 4 mg (has no administration in time range)    ED Course/ Medical Decision Making/ A&P Clinical Course as of 02/18/23 1715  Fri Feb 18, 2023  1519 Creatinine: 0.55 [JS]    Clinical Course User Index [JS] Claude Manges, PA-C                                  Medical Decision Making Amount and/or Complexity of Data Reviewed Labs: ordered. Decision-making details documented in ED Course.  Risk Prescription drug management.   This patient presents to the ED for concern of BL flank pain, this involves a number of treatment options, and is a complaint that carries with it a high risk of complications and morbidity.  The differential diagnosis includes pyelonephritis, renal colic versus recurrent malignancy.    Co morbidities: Discussed in HPI   Brief History:  See HPI.   EMR reviewed including pt PMHx, past surgical history and past visits to ER.   See HPI for more details   Lab Tests:  I ordered and independently interpreted labs.  The pertinent results include:    I personally reviewed all laboratory work and imaging. Metabolic panel without any acute abnormality specifically kidney function within normal limits and no significant electrolyte abnormalities. CBC without leukocytosis or significant anemia.   Imaging Studies:  CT Renal stone  Medicines ordered:  I ordered medication including zofran  for nausea medication Reevaluation of the patient after these medicines showed that the patient improved I have reviewed the patients home medicines and have made adjustments as needed  Reevaluation:  After the interventions noted above I re-evaluated patient and found that they have :improved   Social Determinants of Health:  The patient's social determinants of health were a factor in the care of this patient   Problem List / ED Course:  Patient with underlying history of left robotic assisted partial nephrectomy in April 2023 by Dr. Zebedee Iba here with bilateral flank pain that began 3 days ago, accompanied by malodorous urine, she is concerned for infection as these symptoms are how she show up last time.  She has also a prior history of kidney cancer, 2.7 cm mixed cystic and solid mass  lateral aspect left kidney suspicious for renal carcinoma.  These records were obtained from her prior visit on May 2023 for her prior diagnoses of renal carcinoma.  Patient reports after this procedure her insurance lapsed therefore she was unable to obtain any follow-up.  Today her labs are within normal limits, CBC with no leukocytosis.  BMP with no electrolyte derangement, creatinine levels unremarkable.  She is not having any right upper quadrant pain but hepatic function panel was also ordered although she has a prior history of cholecystectomy.  UA without any nitrites or leukocytes to suggest infection at this time.  Seeing his prior underlying history of carcinoma will obtain CT scan to further evaluate her pain. She was given Zofran to help with her nausea, CT scan reveals some concern for constipation.  She does have nonobstructive stones bilaterally and she is aware of these.  She does not have any urology care here with a prior diagnosis of renal carcinoma we discussed appropriate follow-up.  Patient otherwise hemodynamically stable, no signs of infection to her urine in order to treat today.  We discussed follow-up with [facility], hemodynamically stable for discharge.   Dispostion:  After consideration of the diagnostic results and the patients response to treatment, I feel that the patent would benefit from follow-up with urology on an outpatient basis.   Portions of this note were generated with Scientist, clinical (histocompatibility and immunogenetics). Dictation errors may occur despite best attempts at proofreading.   Final Clinical Impression(s) / ED Diagnoses Final diagnoses:  Bilateral flank pain    Rx / DC Orders ED Discharge Orders     None         Claude Manges, PA-C 02/18/23 1716    Charlynne Pander, MD 02/19/23 1450

## 2023-04-14 ENCOUNTER — Encounter (HOSPITAL_COMMUNITY): Payer: Self-pay | Admitting: Emergency Medicine

## 2023-04-14 ENCOUNTER — Emergency Department (HOSPITAL_COMMUNITY)
Admission: EM | Admit: 2023-04-14 | Discharge: 2023-04-14 | Disposition: A | Discharge status: Home / Self Care | Payer: 59 | Attending: Emergency Medicine | Admitting: Emergency Medicine

## 2023-04-14 ENCOUNTER — Emergency Department (HOSPITAL_COMMUNITY): Payer: 59

## 2023-04-14 ENCOUNTER — Other Ambulatory Visit: Payer: Self-pay

## 2023-04-14 DIAGNOSIS — Z85528 Personal history of other malignant neoplasm of kidney: Secondary | ICD-10-CM | POA: Insufficient documentation

## 2023-04-14 DIAGNOSIS — R109 Unspecified abdominal pain: Secondary | ICD-10-CM | POA: Insufficient documentation

## 2023-04-14 DIAGNOSIS — R42 Dizziness and giddiness: Secondary | ICD-10-CM | POA: Diagnosis not present

## 2023-04-14 HISTORY — DX: Malignant (primary) neoplasm, unspecified: C80.1

## 2023-04-14 LAB — URINALYSIS, ROUTINE W REFLEX MICROSCOPIC
Bilirubin Urine: NEGATIVE
Glucose, UA: NEGATIVE mg/dL
Hgb urine dipstick: NEGATIVE
Ketones, ur: NEGATIVE mg/dL
Leukocytes,Ua: NEGATIVE
Nitrite: NEGATIVE
Protein, ur: NEGATIVE mg/dL
Specific Gravity, Urine: 1.017 (ref 1.005–1.030)
pH: 5 (ref 5.0–8.0)

## 2023-04-14 LAB — CBC
HCT: 41.3 % (ref 36.0–46.0)
Hemoglobin: 13.1 g/dL (ref 12.0–15.0)
MCH: 28.9 pg (ref 26.0–34.0)
MCHC: 31.7 g/dL (ref 30.0–36.0)
MCV: 91 fL (ref 80.0–100.0)
Platelets: 189 10*3/uL (ref 150–400)
RBC: 4.54 MIL/uL (ref 3.87–5.11)
RDW: 13.4 % (ref 11.5–15.5)
WBC: 5.4 10*3/uL (ref 4.0–10.5)
nRBC: 0 % (ref 0.0–0.2)

## 2023-04-14 LAB — HCG, SERUM, QUALITATIVE: Preg, Serum: NEGATIVE

## 2023-04-14 LAB — BASIC METABOLIC PANEL
Anion gap: 9 (ref 5–15)
BUN: 11 mg/dL (ref 6–20)
CO2: 24 mmol/L (ref 22–32)
Calcium: 9 mg/dL (ref 8.9–10.3)
Chloride: 104 mmol/L (ref 98–111)
Creatinine, Ser: 0.62 mg/dL (ref 0.44–1.00)
GFR, Estimated: >60 mL/min (ref >60)
Glucose, Bld: 99 mg/dL (ref 70–99)
Potassium: 4.1 mmol/L (ref 3.5–5.1)
Sodium: 137 mmol/L (ref 135–145)

## 2023-04-14 MED ORDER — IOHEXOL 350 MG/ML SOLN
75.0000 mL | Freq: Once | INTRAVENOUS | Status: AC | PRN
  Administered 2023-04-14: 75 mL via INTRAVENOUS

## 2023-04-14 NOTE — Discharge Instructions (Signed)
Your labs that were done today were normal.  Your CT imaging was also normal.  Included in your discharge paperwork is the telephone number for urologist.  It is important that you have routine follow-up with urology specialist.  You can call them this week to set up a follow-up appointment.

## 2023-04-14 NOTE — ED Triage Notes (Signed)
Pt. Stated, Ive had left flank pain that started 2 days ago and its gotten worse. Ive also had episodes of dizziness. I had cancer in my kidney and only have half of kidney. Not sure if I have a kidney stone.

## 2023-04-14 NOTE — ED Provider Notes (Signed)
[place] EMERGENCY DEPARTMENT AT [facility] Provider Note   CSN: [id number] Arrival date & time: 04/14/23  0865     History  Chief Complaint  Patient presents with   Flank Pain   Dizziness    Tammy Buckley is a 42 y.o. female.  This is a 42 year old female here today with left-sided flank pain.  Patient has a history of renal CA, status post left-sided partial nephrectomy.  She has been lost to follow-up for this procedure, having issues with insurance.  She says that she now has insurance, and is hoping to establish follow-up.  She says that she has been having pain over her last couple of days on her left side.  She says that sometimes the pain makes her feel dizzy.   Flank Pain  Dizziness      Home Medications Prior to Admission medications   Medication Sig Start Date End Date Taking? Authorizing Provider  fluticasone (FLONASE) 50 MCG/ACT nasal spray Place 1 spray into both nostrils daily for 3 days. 02/18/22 02/21/22  Gustavus Bryant, FNP  orphenadrine (NORFLEX) 100 MG tablet Take 100 mg by mouth 2 (two) times daily as needed for muscle spasms. Patient not taking: No sig reported    [provider]      Allergies    Oxycodone    Review of Systems   Review of Systems  Genitourinary:  Positive for flank pain.  Neurological:  Positive for dizziness.    Physical Exam Updated Vital Signs BP (!) 108/41 (BP Location: Right Arm)   Pulse 62   Temp 98.1 F (36.7 C) (Oral)   Resp 16   Ht 5\' 6"  (1.676 m)   LMP 04/04/2023   SpO2 100%   BMI 20.98 kg/m  Physical Exam Vitals reviewed.  Constitutional:      Appearance: She is not ill-appearing.  Pulmonary:     Effort: Pulmonary effort is normal.  Abdominal:     General: Abdomen is flat.     Palpations: Abdomen is soft.     Tenderness: There is left CVA tenderness. There is no right CVA tenderness or guarding.  Neurological:     Mental Status: She is alert.     Gait: Gait normal.      ED Results / Procedures / Treatments   Labs (all labs ordered are listed, but only abnormal results are displayed) Labs Reviewed  URINALYSIS, ROUTINE W REFLEX MICROSCOPIC - Abnormal; Notable for the following components:      Result Value   APPearance HAZY (*)    All other components within normal limits  BASIC METABOLIC PANEL  CBC  HCG, SERUM, QUALITATIVE  CBG MONITORING, ED    EKG None  Radiology CT ABDOMEN PELVIS W CONTRAST  Result Date: 04/14/2023 CLINICAL DATA:  Left flank pain that started 2 days ago with recent worsening. Patient reports a history of partial nephrectomy. EXAM: CT ABDOMEN AND PELVIS WITH CONTRAST TECHNIQUE: Multidetector CT imaging of the abdomen and pelvis was performed using the standard protocol following bolus administration of intravenous contrast. RADIATION DOSE REDUCTION: This exam was performed according to the departmental dose-optimization program which includes automated exposure control, adjustment of the mA and/or kV according to patient size and/or use of iterative reconstruction technique. CONTRAST:  75mL OMNIPAQUE IOHEXOL 350 MG/ML SOLN COMPARISON:  02/18/2023 FINDINGS: Lower chest: Unremarkable. Hepatobiliary: No suspicious focal abnormality within the liver parenchyma. Gallbladder is surgically absent. No intrahepatic or extrahepatic biliary dilation. Pancreas: No focal mass lesion. No dilatation  of the main duct. No intraparenchymal cyst. No peripancreatic edema. Spleen: No splenomegaly. No suspicious focal mass lesion. Adrenals/Urinary Tract: No splenomegaly. No suspicious focal mass lesion. 3 mm nonobstructing stone identified lower pole right kidney. Defect in the lateral upper interpolar left kidney is compatible with previous partial nephrectomy. Punctate nonobstructing stone identified interpolar left kidney. Tiny hypodensities in the left renal cortex are too small to characterize but are statistically most likely benign. Given the history  of partial nephrectomy, comparison to previous postcontrast imaging could be used to ensure stability of these findings. No evidence for hydroureter. The urinary bladder appears normal for the degree of distention. Stomach/Bowel: Stomach is unremarkable. No gastric wall thickening. No evidence of outlet obstruction. Duodenum is normally positioned as is the ligament of Treitz. No small bowel wall thickening. No small bowel dilatation. The terminal ileum is normal. The appendix is normal. No gross colonic mass. No colonic wall thickening. Vascular/Lymphatic: Insert no abdominal aortic aneurysm There is no gastrohepatic or hepatoduodenal ligament lymphadenopathy. No retroperitoneal or mesenteric lymphadenopathy. No pelvic sidewall lymphadenopathy. Reproductive: 10 mm hyperattenuating intramural lesion posterior uterine body is probably a fibroid. There is no adnexal mass. Other: Trace free fluid is seen in the cul-de-sac, likely physiologic. Musculoskeletal: No worrisome lytic or sclerotic osseous abnormality. IMPRESSION: 1. No acute findings in the abdomen or pelvis. Specifically, no findings to explain the patient's history of left flank pain. 2. Bilateral nonobstructing renal stones. 3. Status post left partial nephrectomy. 4. Tiny hypodensities in the left renal cortex are too small to characterize but are statistically most likely benign. Given size of these lesions, no specific imaging follow-up of these lesions is recommended although these are probably evaluated on previous imaging prior to partial nephrectomy. 5. Trace free fluid in the cul-de-sac, likely physiologic. Electronically Signed   By: Kennith Center M.D.   On: 04/14/2023 14:03    Procedures Procedures    Medications Ordered in ED Medications  iohexol (OMNIPAQUE) 350 MG/ML injection 75 mL (75 mLs Intravenous Contrast Given 04/14/23 1219)    ED Course/ Medical Decision Making/ A&P                                 Medical Decision  Making Is a 42 year old female is here today for left-sided flank pain.  Differential diagnoses include obstruction, renal CA, pyelonephritis.  Plan-will obtain imaging of the patient's abdomen.  Patient overall looks well.  Does not appear to be in significant discomfort at this time.  Imaging and urinalysis will rule in/rule out pyelonephritis, obstruction, recurrent CVA.  This patient's health care is complicated by the following social determinants of health-lack of access to primary care.  Reassessment 2:10 PM-urinalysis is not for signs of infection.  Patient's kidney function normal.  No acute findings on CT imaging.  Patient overall doing well.  Will provide urology follow-up for the patient.  Amount and/or Complexity of Data Reviewed Labs: ordered. Radiology: ordered.  Risk Prescription drug management.           Final Clinical Impression(s) / ED Diagnoses Final diagnoses:  Left flank pain    Rx / DC Orders ED Discharge Orders     None         Arletha Pili, DO 04/14/23 1416

## 2023-07-22 ENCOUNTER — Telehealth: Payer: 59 | Admitting: Family Medicine

## 2023-07-22 DIAGNOSIS — N390 Urinary tract infection, site not specified: Secondary | ICD-10-CM | POA: Diagnosis not present

## 2023-07-22 MED ORDER — CEPHALEXIN 500 MG PO CAPS: 500.0000 mg | ORAL_CAPSULE | Freq: Two times a day (BID) | ORAL | 0 refills | Status: AC

## 2023-07-22 NOTE — Patient Instructions (Signed)

## 2023-07-22 NOTE — Progress Notes (Signed)
Virtual Visit Consent   [facility], you are scheduled for a virtual visit with a [facility] provider today. Just as with appointments in the office, your consent must be obtained to participate. Your consent will be active for this visit and any virtual visit you may have with one of our providers in the next 365 days. If you have a MyChart account, a copy of this consent can be sent to you electronically.  As this is a virtual visit, video technology does not allow for your provider to perform a traditional examination. This may limit your provider's ability to fully assess your condition. If your provider identifies any concerns that need to be evaluated in person or the need to arrange testing (such as labs, EKG, etc.), we will make arrangements to do so. Although advances in technology are sophisticated, we cannot ensure that it will always work on either your end or our end. If the connection with a video visit is poor, the visit may have to be switched to a telephone visit. With either a video or telephone visit, we are not always able to ensure that we have a secure connection.  By engaging in this virtual visit, you consent to the provision of healthcare and authorize for your insurance to be billed (if applicable) for the services provided during this visit. Depending on your insurance coverage, you may receive a charge related to this service.  I need to obtain your verbal consent now. Are you willing to proceed with your visit today? Tammy Buckley has provided verbal consent on 07/22/2023 for a virtual visit (video or telephone). Tammy Curio, FNP  Date: 07/22/2023 10:31 AM   Virtual Visit via Video Note   I, Tammy Buckley, connected with  Tammy Buckley  ([id number], Nov 09, 1980) on 07/22/23 at 10:30 AM EST by a video-enabled telemedicine application and verified that I am speaking with the correct person using two identifiers.  Location: Patient: Virtual Visit Location Patient:  Home Provider: Virtual Visit Location Provider: Home Office   I discussed the limitations of evaluation and management by telemedicine and the availability of in person appointments. The patient expressed understanding and agreed to proceed.    History of Present Illness: Tammy Buckley is a 43 y.o. who identifies as a female who was assigned female at birth, and is being seen today for frequency, burning and urgency on urination for 1 day. At home test positive. No fever or abd pain. She has a history of kidney cancer. Marland Kitchen  HPI: HPI  Problems:  Patient Active Problem List   Diagnosis Date Noted   Pap smear of cervix with ASCUS, cannot exclude HGSIL 07/20/2018    Allergies:  Allergies  Allergen Reactions   Oxycodone Nausea And Vomiting   Medications:  Current Outpatient Medications:    fluticasone (FLONASE) 50 MCG/ACT nasal spray, Place 1 spray into both nostrils daily for 3 days., Disp: 16 g, Rfl: 0   orphenadrine (NORFLEX) 100 MG tablet, Take 100 mg by mouth 2 (two) times daily as needed for muscle spasms. (Patient not taking: No sig reported), Disp: , Rfl:   Observations/Objective: Patient is well-developed, well-nourished in no acute distress.  Resting comfortably  at home.  Head is normocephalic, atraumatic.  No labored breathing.  Speech is clear and coherent with logical content.  Patient is alert and oriented at baseline.    Assessment and Plan: 1. Urinary tract infection without hematuria, site unspecified (Primary)  Increase fluids, UC if sx persist  or worsen   Follow Up Instructions: I discussed the assessment and treatment plan with the patient. The patient was provided an opportunity to ask questions and all were answered. The patient agreed with the plan and demonstrated an understanding of the instructions.  A copy of instructions were sent to the patient via MyChart unless otherwise noted below.     The patient was advised to call back or seek an in-person  evaluation if the symptoms worsen or if the condition fails to improve as anticipated.    Tammy Curio, FNP

## 2023-09-14 IMAGING — US US BREAST*L* LIMITED INC AXILLA
1 series · 5 of 5 positions shown · non-contrast
Comparison: Previous exam(s).

CLINICAL DATA: Patient for follow-up of probably benign left breast
mass. Patient also reports spontaneous greenish and white nipple
discharge bilaterally.

EXAM:
DIGITAL DIAGNOSTIC UNILATERAL LEFT MAMMOGRAM WITH TOMOSYNTHESIS AND
CAD; ULTRASOUND LEFT BREAST LIMITED
TECHNIQUE: Left digital diagnostic mammography and breast tomosynthesis was
performed. The images were evaluated with computer-aided detection.;
Targeted ultrasound examination of the left breast was performed.

[Series 1: us breast*left* limited inc axilla · 0.07mm/px · 5 of 5 slices shown]
[im 1/5]
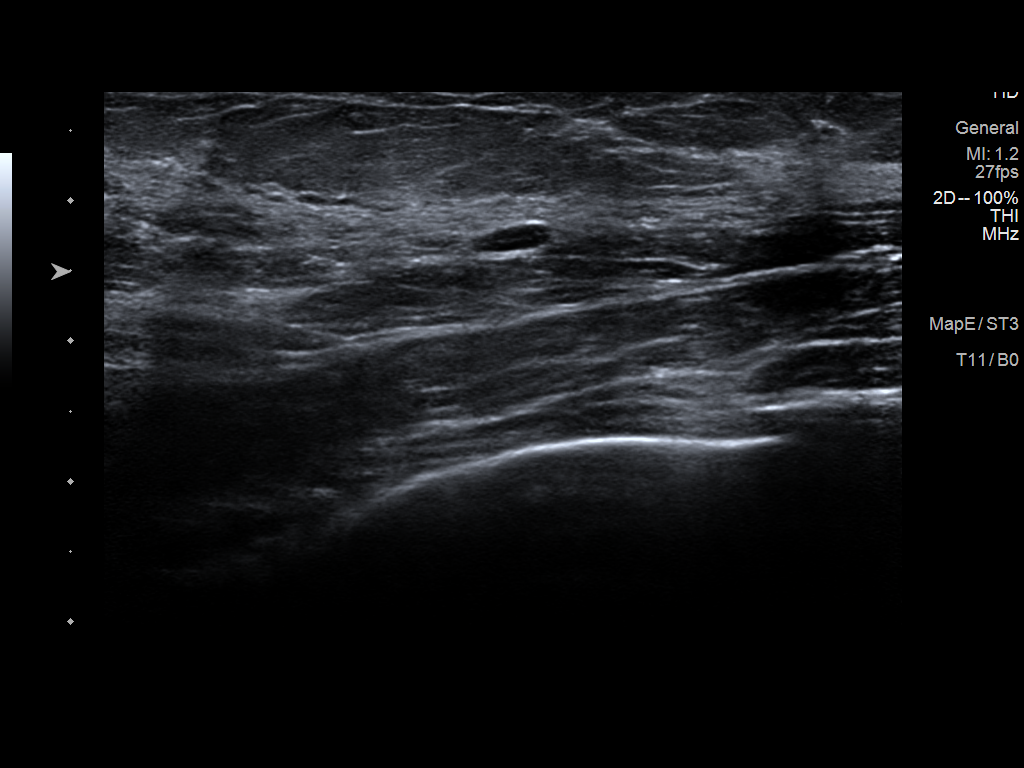
[im 2/5]
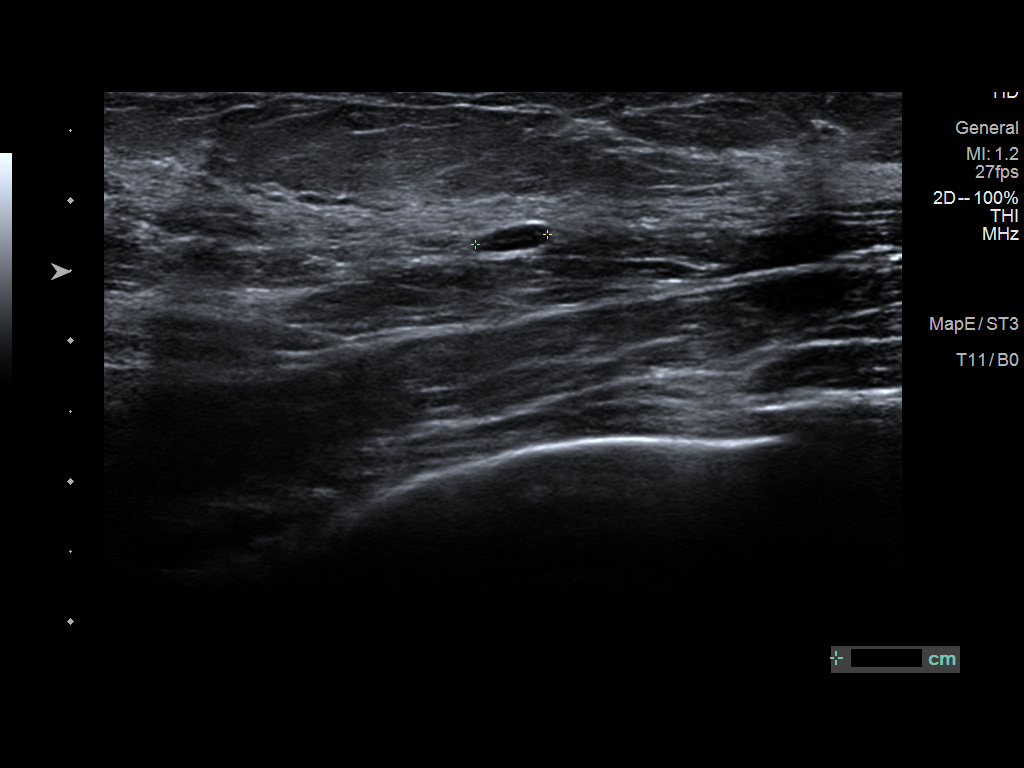
[im 3/5]
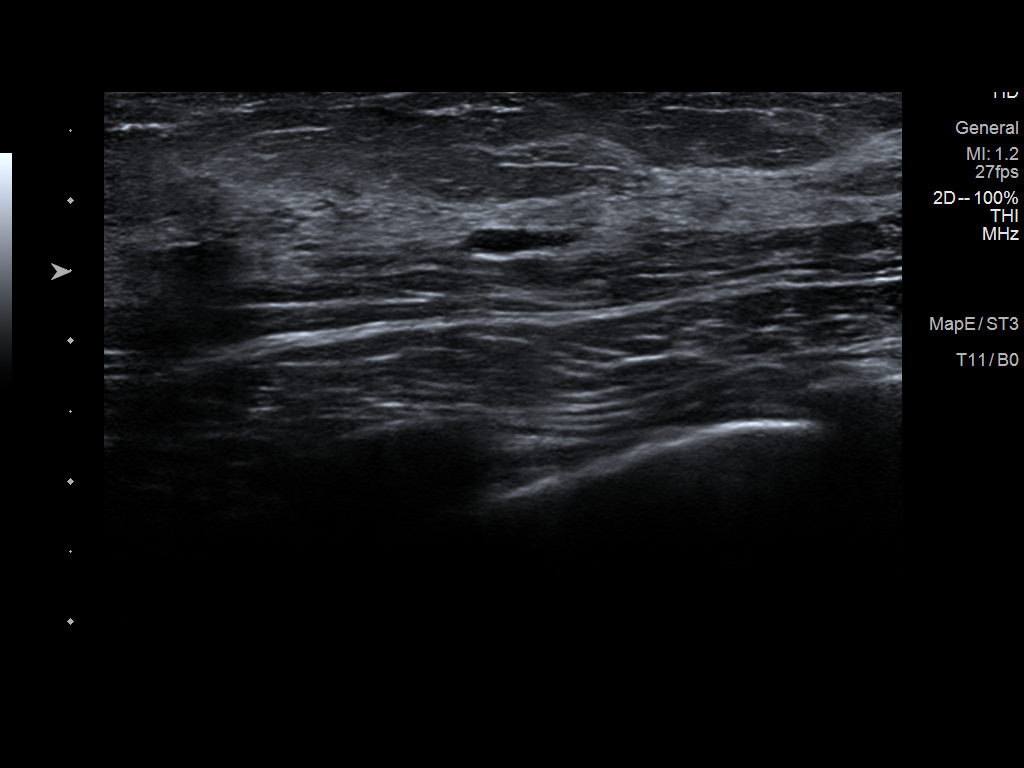
[im 4/5]
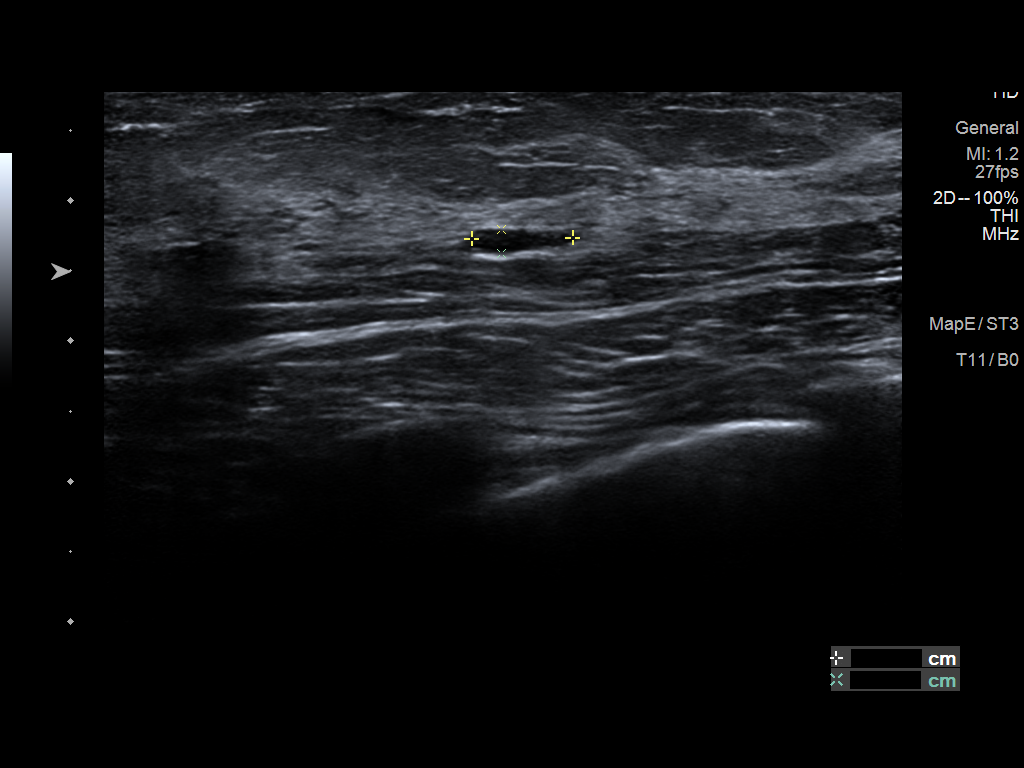
[im 5/5]
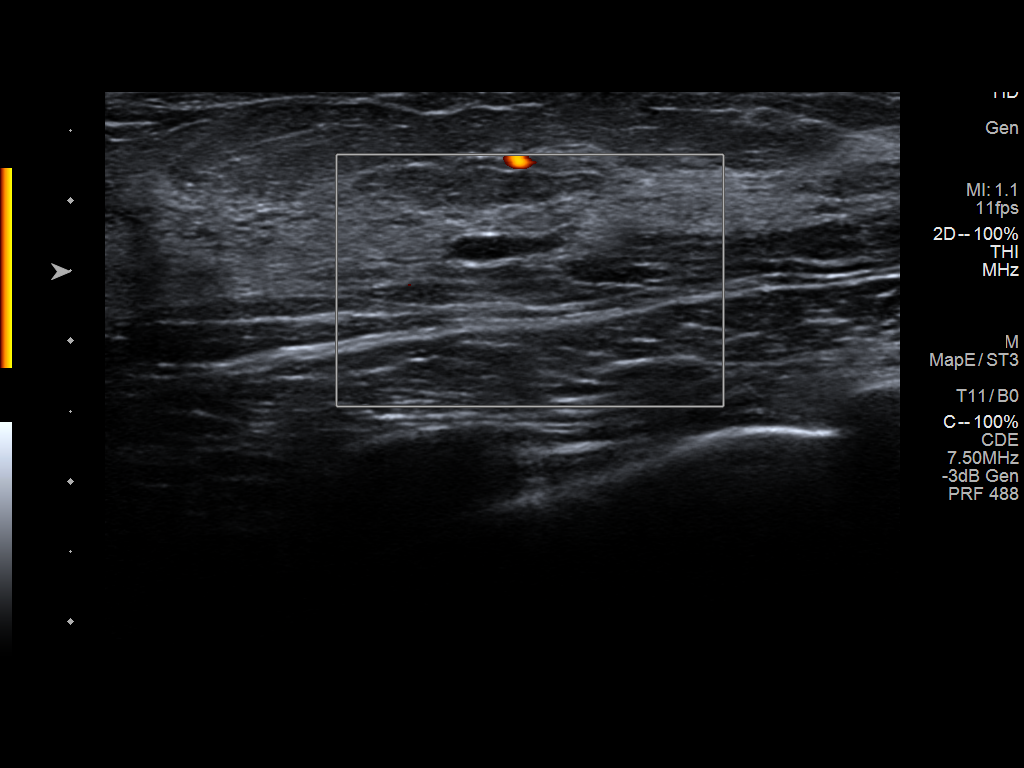

[5 of 5 positions shown; findings below may reference images not displayed]

ACR Breast Density Category c: The breast tissue is heterogeneously
dense, which may obscure small masses.
FINDINGS: No concerning masses, calcifications or distortion identified within
the left breast.

Targeted ultrasound is performed, showing stable to mild interval
decrease in size of oval circumscribed hypoechoic mass left breast
1:30 o'clock 3 cm from nipple measuring 5 x 7 x 2 mm.
IMPRESSION: Stable probably benign left breast mass 1:30 o'clock.

Likely physiologic nipple discharge as it was reported to be
bilateral, white and non spontaneous in etiology.

RECOMMENDATION:
Bilateral diagnostic mammography and left breast ultrasound [DATE].

I have discussed the findings and recommendations with the patient.
If applicable, a reminder letter will be sent to the patient
regarding the next appointment.

BI-RADS CATEGORY  3: Probably benign.

## 2023-09-14 IMAGING — MG MM DIGITAL DIAGNOSTIC UNILAT*L* W/ TOMO W/ CAD
6 series · 6 of 18 positions shown · non-contrast
Comparison: Previous exam(s).

CLINICAL DATA: Patient for follow-up of probably benign left breast
mass. Patient also reports spontaneous greenish and white nipple
discharge bilaterally.

EXAM:
DIGITAL DIAGNOSTIC UNILATERAL LEFT MAMMOGRAM WITH TOMOSYNTHESIS AND
CAD; ULTRASOUND LEFT BREAST LIMITED
TECHNIQUE: Left digital diagnostic mammography and breast tomosynthesis was
performed. The images were evaluated with computer-aided detection.;
Targeted ultrasound examination of the left breast was performed.

[L CC synth-2D]
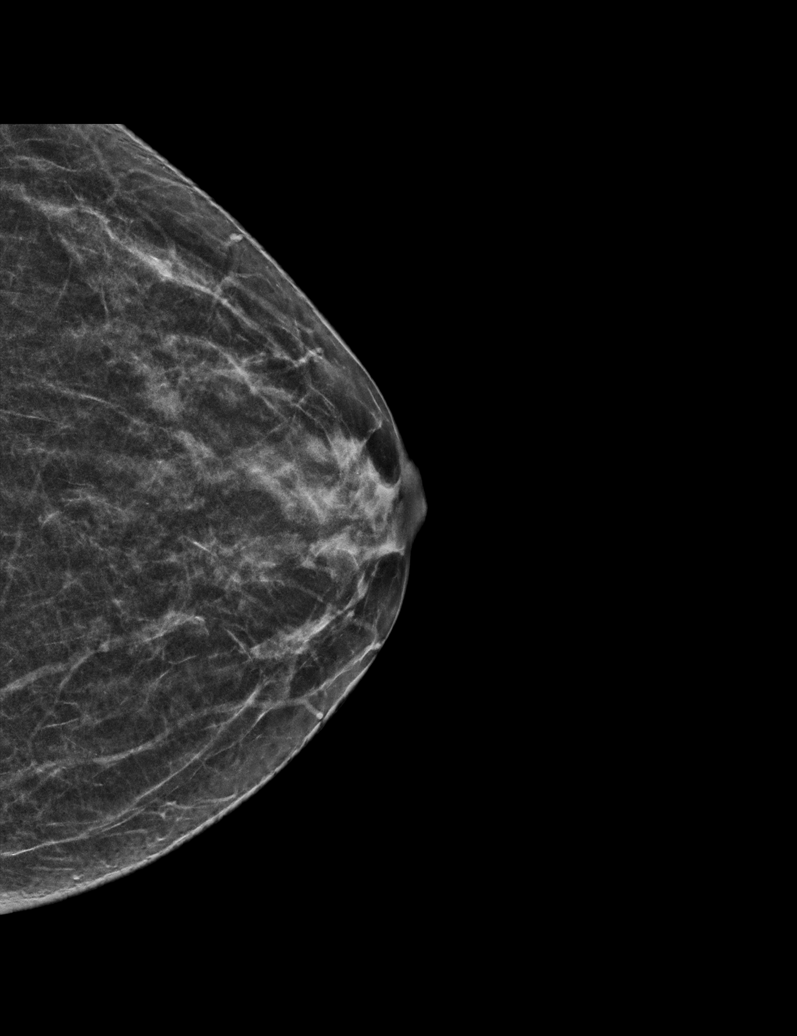

[L MLO synth-2D (1 of 2)]
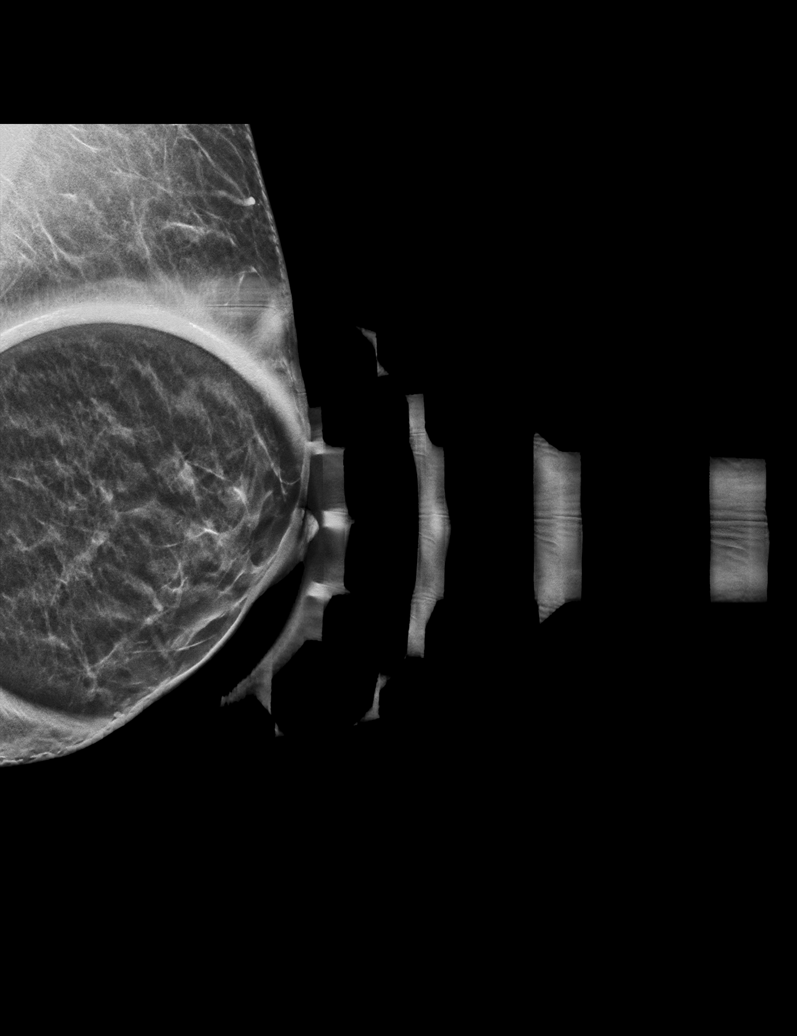

[L MLO synth-2D (2 of 2)]
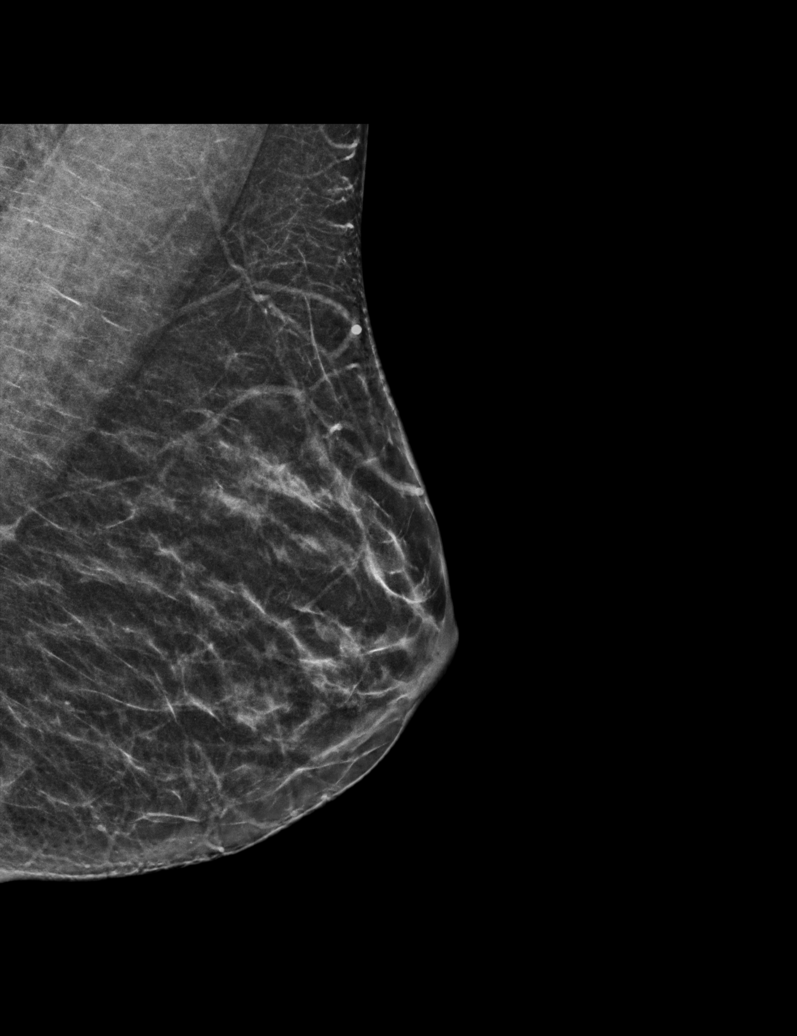

[L CC tomo · tomo slice 23/46.0]
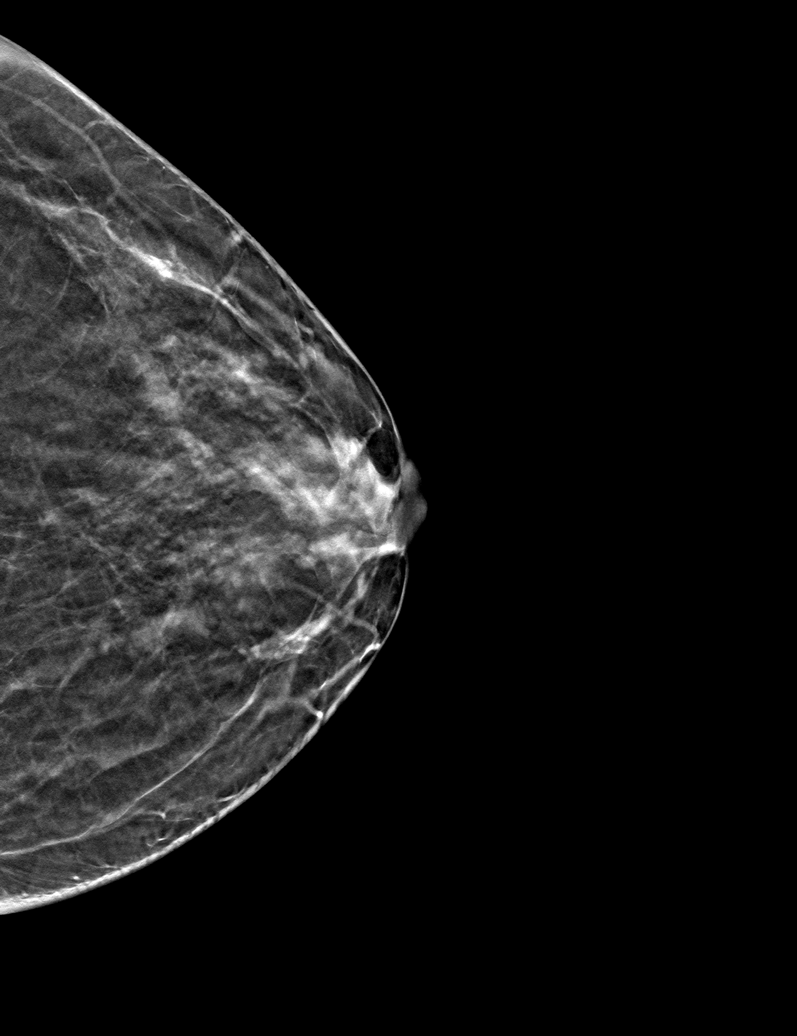

[L MLO tomo (1 of 2) · tomo slice 27/53.0]
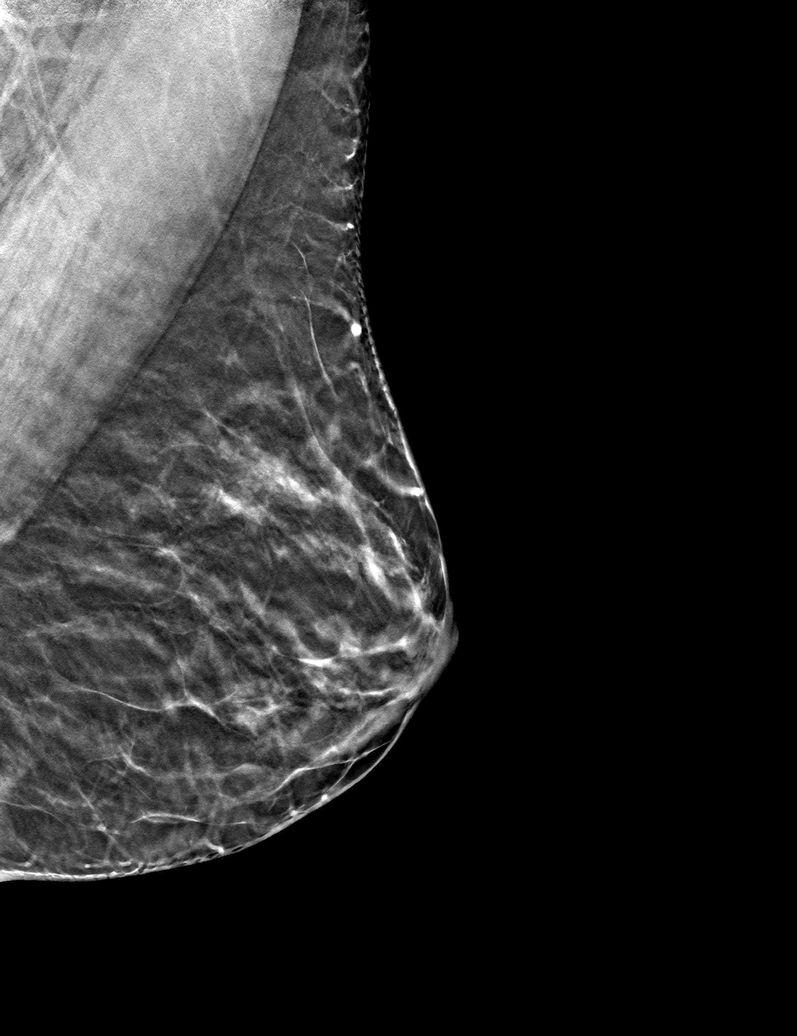

[L MLO tomo (2 of 2) · tomo slice 21/41.0]
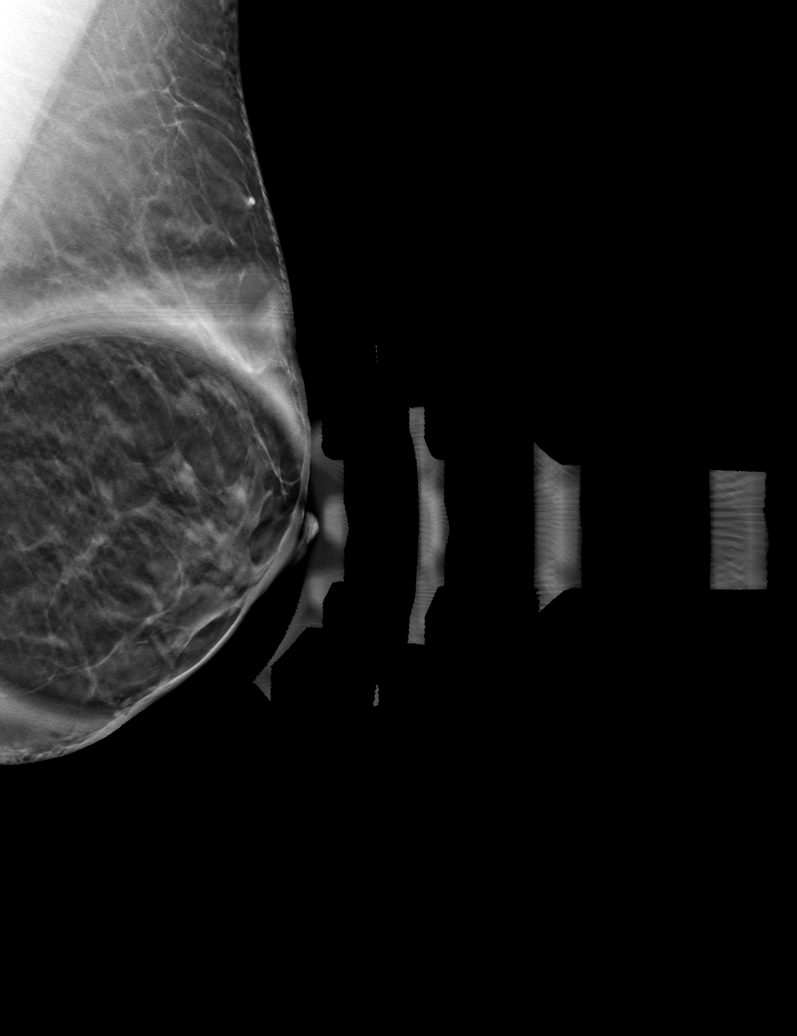

[6 of 18 positions shown; findings below may reference images not displayed]

ACR Breast Density Category c: The breast tissue is heterogeneously
dense, which may obscure small masses.
FINDINGS: No concerning masses, calcifications or distortion identified within
the left breast.

Targeted ultrasound is performed, showing stable to mild interval
decrease in size of oval circumscribed hypoechoic mass left breast
1:30 o'clock 3 cm from nipple measuring 5 x 7 x 2 mm.
IMPRESSION: Stable probably benign left breast mass 1:30 o'clock.

Likely physiologic nipple discharge as it was reported to be
bilateral, white and non spontaneous in etiology.

RECOMMENDATION:
Bilateral diagnostic mammography and left breast ultrasound [DATE].

I have discussed the findings and recommendations with the patient.
If applicable, a reminder letter will be sent to the patient
regarding the next appointment.

BI-RADS CATEGORY  3: Probably benign.

## 2023-11-03 ENCOUNTER — Ambulatory Visit
Admission: RE | Admit: 2023-11-03 | Discharge: 2023-11-03 | Disposition: A | Discharge status: Home / Self Care | Payer: MEDICAID | Source: Ambulatory Visit | Attending: Emergency Medicine | Admitting: Emergency Medicine

## 2023-11-03 VITALS — BP 120/78 | HR 104 | Temp 98.3°F | Resp 17

## 2023-11-03 DIAGNOSIS — R21 Rash and other nonspecific skin eruption: Secondary | ICD-10-CM | POA: Insufficient documentation

## 2023-11-03 DIAGNOSIS — R109 Unspecified abdominal pain: Secondary | ICD-10-CM | POA: Diagnosis present

## 2023-11-03 LAB — POCT URINALYSIS DIP (MANUAL ENTRY)
Bilirubin, UA: NEGATIVE
Blood, UA: NEGATIVE
Glucose, UA: NEGATIVE mg/dL
Ketones, POC UA: NEGATIVE mg/dL
Nitrite, UA: NEGATIVE
Protein Ur, POC: NEGATIVE mg/dL
Spec Grav, UA: 1.025 (ref 1.010–1.025)
Urobilinogen, UA: 0.2 U/dL
pH, UA: 6 (ref 5.0–8.0)

## 2023-11-03 LAB — POCT URINE PREGNANCY: Preg Test, Ur: NEGATIVE

## 2023-11-03 MED ORDER — SULFAMETHOXAZOLE-TRIMETHOPRIM 800-160 MG PO TABS: 1.0000 | ORAL_TABLET | Freq: Two times a day (BID) | ORAL | 0 refills | Status: AC

## 2023-11-03 MED ORDER — TRIAMCINOLONE ACETONIDE 0.5 % EX OINT: 1.0000 | TOPICAL_OINTMENT | Freq: Two times a day (BID) | CUTANEOUS | 0 refills | Status: AC

## 2023-11-03 MED ORDER — FLUCONAZOLE 150 MG PO TABS: 150.0000 mg | ORAL_TABLET | Freq: Every day | ORAL | 0 refills | Status: DC

## 2023-11-03 NOTE — ED Provider Notes (Signed)
 [facility]    CSN: [id number] Arrival date & time: 11/03/23  1851      History   Chief Complaint Chief Complaint  Patient presents with   Flank Pain    I need a urine culture to determine the severity of my kidney infection. 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  I also have some type of rash on my left leg that needs to be looked at as well for diagnosis. - Entered by patient    HPI Maanasa Aderhold is a 43 y.o. female.   Patient presents to clinic over concern of right-sided flank pain that has been ongoing for the past 2 weeks as well as cloudy urine and malodorous urine.  She has not had any dysuria, urgency or frequency and denies hematuria.  Reports a recent UTI of this kidney, this kidney also has a duplex and it.  History of renal cancer and nephrectomy of the left kidney.  Has not had fevers.  Has an itchy rash to the anterior left shin that has been present for the past 3 weeks.  Has tried moisturizing lotions and over-the-counter steroids without much improvement.  She will wake up at night itching so she has been putting a compression sleeve on it to stop herself from itching it while sleeping.  History of eczema, this is usually to her face.   The history is provided by the patient and medical records.  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 Authorizing Provider  fluconazole (DIFLUCAN) 150 MG tablet Take 1 tablet (150 mg total) by mouth daily. 11/03/23  Yes Klaus Casteneda  N, FNP  sulfamethoxazole-trimethoprim (BACTRIM DS) 800-160 MG tablet Take 1 tablet by mouth 2 (two) times daily for 3 days. 11/03/23 11/06/23 Yes Jullian Clayson  N, FNP  triamcinolone ointment (KENALOG) 0.5 % Apply 1 Application topically 2 (two) times daily. 11/03/23  Yes Lorma Heater  N, FNP  fluticasone  (FLONASE ) 50 MCG/ACT nasal spray Place 1 spray into both nostrils daily for 3 days. 02/18/22 02/21/22  Dodson Freestone, FNP  orphenadrine (NORFLEX)  100 MG tablet Take 100 mg by mouth 2 (two) times daily as needed for muscle spasms. Patient not taking: No sig reported    [provider]    Family History Family History  Problem Relation Age of Onset   Hypertension Mother    Diabetes Mother    Stroke Mother    Kidney failure Mother    Heart failure Mother    Diabetes Father    Breast cancer Neg Hx     Social History Social History   Tobacco Use   Smoking status: Every Day    Current packs/day: 0.10    Types: Cigarettes   Smokeless tobacco: Never   Tobacco comments:    5 cigs per day  Vaping Use   Vaping status: Never Used  Substance Use Topics   Alcohol use: Not Currently    Comment: rare   Drug use: No     Allergies   Oxycodone   Review of Systems Review of Systems  Per HPI  Physical Exam Triage Vital Signs ED Triage Vitals [11/03/23 1909]  Encounter Vitals Group     BP 120/78     Systolic BP Percentile      Diastolic BP Percentile      Pulse Rate (!) 104     Resp 17     Temp 98.3 F (36.8 C)     Temp  Source Oral     SpO2 98 %     Weight      Height      Head Circumference      Peak Flow      Pain Score      Pain Loc      Pain Education      Exclude from Growth Chart    No data found.  Updated Vital Signs BP 120/78 (BP Location: Right Arm)   Pulse (!) 104   Temp 98.3 F (36.8 C) (Oral)   Resp 17   SpO2 98%   Visual Acuity Right Eye Distance:   Left Eye Distance:   Bilateral Distance:    Right Eye Near:   Left Eye Near:    Bilateral Near:     Physical Exam Vitals and nursing note reviewed.  Constitutional:      Appearance: Normal appearance.  HENT:     Head: Normocephalic and atraumatic.     Right Ear: External ear normal.     Left Ear: External ear normal.     Nose: Nose normal.     Mouth/Throat:     Mouth: Mucous membranes are moist.  Eyes:     Conjunctiva/sclera: Conjunctivae normal.  Cardiovascular:     Rate and Rhythm: Normal rate and regular rhythm.     Heart sounds: Normal heart sounds. No murmur heard. Pulmonary:     Effort: Pulmonary effort is normal. No respiratory distress.  Skin:    Findings: Rash present. Rash is scaling and urticarial.          Comments: Urticarial dry scaling rash to the left shin.  Neurological:     General: No focal deficit present.     Mental Status: She is alert.  Psychiatric:        Mood and Affect: Mood normal.      UC Treatments / Results  Labs (all labs ordered are listed, but only abnormal results are displayed) Labs Reviewed  POCT URINALYSIS DIP (MANUAL ENTRY) - Abnormal; Notable for the following components:      Result Value  Leukocytes, UA Trace (*)    All other components within normal limits  URINE CULTURE  POCT URINE PREGNANCY    EKG   Radiology No results found.  Procedures Procedures (including critical care time)  Medications Ordered in UC Medications - No data to display  Initial Impression / Assessment and Plan / UC Course  I have reviewed the triage vital signs and the  nursing notes.  Pertinent labs & imaging results that were available during my care of the patient were reviewed by me and considered in my medical decision making (see chart for details).  Vitals in triage reviewed, patient is hemodynamically stable.  Urinalysis shows trace leukocytes, will send for culture.  Afebrile, without nausea or vomiting, without fever, low concern for pyelonephritis at this time.  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  Rash to left anterior shin is urticarial and scaling.  Will trial topical steroid ointment combined with Aquaphor to help retain moisture.  Plan of care, follow-up care return precautions given, no questions at this time.    Final Clinical Impressions(s) / UC Diagnoses   Final diagnoses:  Flank pain  Rash and nonspecific skin eruption     Discharge Instructions      Due to your flank pain, I am treating you with Bactrim.  Take this twice daily with food for the next 3 days.  Ensure you are drinking plenty of water to help flush your kidneys.  Take the Diflucan at the end of this to help prevent vaginal yeast infection.  We have sent the urine sample off for culture and we will contact you if we need to modify your antibiotic therapy at all.  25 mg of Benadryl every 6 hours can help with your itching.  I would also start a daily antihistamine in case there is any allergic component to this.  Use the topical steroid ointment mixed with Aquaphor twice daily to help with inflammation and to provide moisture.  Rash should improve over the next few days, if no improvement or any changes follow-up with your primary care provider for reevaluation.   ED Prescriptions     Medication Sig Dispense Auth. Provider   sulfamethoxazole-trimethoprim (BACTRIM DS) 800-160 MG tablet Take 1 tablet by mouth 2 (two) times daily for 3 days. 6 tablet Harlow Lighter, Markis Langland  N, FNP    triamcinolone ointment (KENALOG) 0.5 % Apply 1 Application topically 2 (two) times daily. 30 g Harlow Lighter, Maudy Yonan  N, FNP   fluconazole (DIFLUCAN) 150 MG tablet Take 1 tablet (150 mg total) by mouth daily. 1 tablet Harlow Lighter, Danaly Bari  N, FNP      PDMP not reviewed this encounter.   Harlow Lighter, Chara Marquard  N, FNP 11/03/23 1940

## 2023-11-03 NOTE — ED Triage Notes (Signed)
 Pt states she was on abx 1 month ago for uti. C/o urinary symptoms for 2weeks  She also has rash on left shin for 3 weeks which is very itchy and dry.

## 2023-11-03 NOTE — Discharge Instructions (Addendum)
 Due to your flank pain, I am treating you with Bactrim.  Take this twice daily with food for the next 3 days.  Ensure you are drinking plenty of water to help flush your kidneys.  Take the Diflucan at the end of this to help prevent vaginal yeast infection.  We have sent the urine sample off for culture and we will contact you if we need to modify your antibiotic therapy at all.  25 mg of Benadryl every 6 hours can help with your itching.  I would also start a daily antihistamine in case there is any allergic component to this.  Use the topical steroid ointment mixed with Aquaphor twice daily to help with inflammation and to provide moisture.  Rash should improve over the next few days, if no improvement or any changes follow-up with your primary care provider for reevaluation.

## 2023-11-06 LAB — URINE CULTURE: Culture: 70000 — AB

## 2023-11-07 ENCOUNTER — Ambulatory Visit: Payer: Self-pay | Admitting: Emergency Medicine

## 2024-07-11 ENCOUNTER — Telehealth: Payer: MEDICAID

## 2024-07-11 ENCOUNTER — Ambulatory Visit
Admission: EM | Admit: 2024-07-11 | Discharge: 2024-07-11 | Disposition: A | Discharge status: Home / Self Care | Payer: MEDICAID | Source: Home / Self Care

## 2024-07-11 DIAGNOSIS — T3695XA Adverse effect of unspecified systemic antibiotic, initial encounter: Secondary | ICD-10-CM

## 2024-07-11 DIAGNOSIS — B379 Candidiasis, unspecified: Secondary | ICD-10-CM

## 2024-07-11 DIAGNOSIS — R10A1 Flank pain, right side: Secondary | ICD-10-CM

## 2024-07-11 DIAGNOSIS — R10A0 Flank pain, unspecified side: Secondary | ICD-10-CM | POA: Diagnosis not present

## 2024-07-11 DIAGNOSIS — N390 Urinary tract infection, site not specified: Secondary | ICD-10-CM | POA: Diagnosis not present

## 2024-07-11 LAB — POCT URINE DIPSTICK
Bilirubin, UA: NEGATIVE
Blood, UA: NEGATIVE
Glucose, UA: NEGATIVE mg/dL
Ketones, POC UA: NEGATIVE mg/dL
Nitrite, UA: POSITIVE — AB
POC PROTEIN,UA: NEGATIVE
Spec Grav, UA: 1.02
Urobilinogen, UA: 4 U/dL — AB
pH, UA: 7

## 2024-07-11 MED ORDER — FLUCONAZOLE 150 MG PO TABS: 150.0000 mg | ORAL_TABLET | ORAL | 0 refills | Status: AC

## 2024-07-11 MED ORDER — KETOROLAC TROMETHAMINE 30 MG/ML IJ SOLN
30.0000 mg | Freq: Once | INTRAMUSCULAR | Status: AC
  Administered 2024-07-11: 30 mg via INTRAMUSCULAR

## 2024-07-11 MED ORDER — CEFTRIAXONE SODIUM 1 G IJ SOLR
1.0000 g | Freq: Once | INTRAMUSCULAR | Status: AC
  Administered 2024-07-11: 1 g via INTRAMUSCULAR

## 2024-07-11 MED ORDER — SULFAMETHOXAZOLE-TRIMETHOPRIM 800-160 MG PO TABS: 1.0000 | ORAL_TABLET | Freq: Two times a day (BID) | ORAL | 0 refills | Status: AC

## 2024-07-11 NOTE — ED Provider Notes (Addendum)
 " [facility]    CSN: [id number] Arrival date & time: 07/11/24  1454      History   Chief Complaint No chief complaint on file.   HPI Tammy Buckley is a 44 y.o. female.   Tammy Buckley is a 44 y.o. female presenting for chief complaint of right flank pain, dysuria, urinary frequency, and urinary urgency that started 3 days ago. 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 Non-obstructive renal stones have been found on CT in the past. She reports frequent UTIs, last UTI May 2026 treated with bactrim  for 3 days successfully after urine culture grew 70,000 colonies E. Coli.  She's had chills without fever for the last 3 days in addition to urinary symptoms, flank pain, and fatigue.  Denies nausea, vomiting, headache, dizziness, myalgias, and gross hematuria.  Denies abdominal/pelvic pain and vaginal symptoms. Denies recent antibiotic/steroid use in the last 90 days.  She has not followed up with urology in the last year due to lapse in insurance coverage and change to insurance.  She has not attempted treatment of symptoms at home.      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 Authorizing Provider  fluconazole  (DIFLUCAN ) 150 MG tablet Take 1 tablet (150 mg total) by mouth every 3 (three) days. 07/11/24  Yes Enedelia Dorna HERO, FNP  sulfamethoxazole -trimethoprim  (BACTRIM  DS) 800-160 MG tablet Take 1 tablet by mouth 2 (two) times daily for 7 days. 07/11/24 07/18/24 Yes StanhopeDorna HERO, FNP  fluticasone  (FLONASE ) 50 MCG/ACT nasal spray Place 1 spray into both nostrils daily for 3 days. 02/18/22 02/21/22  Hazen Darryle BRAVO, FNP  orphenadrine (NORFLEX) 100 MG tablet Take 100 mg by mouth 2 (two) times daily as needed for muscle spasms. Patient not taking: No sig reported    [provider]  triamcinolone  ointment (KENALOG ) 0.5 % Apply 1 Application topically 2 (two) times daily. 11/03/23   Mercer, [place]  G, FNP    Family History Family History  Problem Relation Age of Onset   Hypertension Mother  Diabetes Mother    Stroke Mother    Kidney failure Mother    Heart failure Mother    Diabetes Father    Breast cancer Neg Hx     Social History Social History[1]   Allergies   Codeine, Hydromorphone, and Oxycodone   Review of Systems Review of Systems Per HPI  Physical Exam Triage Vital Signs ED Triage Vitals  Encounter Vitals Group     BP 07/11/24 1517 111/68     Girls Systolic BP Percentile --      Girls Diastolic BP Percentile --      Boys Systolic BP Percentile --      Boys Diastolic BP Percentile --      Pulse Rate 07/11/24 1517 75     Resp 07/11/24 1517 20     Temp 07/11/24 1517 98.4 F (36.9 C)     Temp Source 07/11/24 1517 Oral     SpO2 07/11/24 1517 98 %     Weight --      Height --      Head Circumference --      Peak Flow --      Pain Score 07/11/24 1519 8     Pain Loc --      Pain Education --      Exclude from Growth Chart --    No data  found.  Updated Vital Signs BP 111/68 (BP Location: Right Arm)   Pulse 75   Temp 98.4 F (36.9 C) (Oral)   Resp 20   LMP 07/04/2024 (Within Days)   SpO2 98%   Visual Acuity Right Eye Distance:   Left Eye Distance:   Bilateral Distance:    Right Eye Near:   Left Eye Near:    Bilateral Near:     Physical Exam Vitals and nursing note reviewed.  Constitutional:      Appearance: She is not ill-appearing or toxic-appearing.  HENT:     Head: Normocephalic and atraumatic.     Right Ear: Hearing and external ear normal.     Left Ear: Hearing and external ear normal.     Nose: Nose normal.     Mouth/Throat:     Lips: Pink.  Eyes:     General: Lids are normal. Vision grossly intact. Gaze aligned appropriately.     Extraocular Movements: Extraocular movements intact.     Conjunctiva/sclera: Conjunctivae normal.  Pulmonary:     Effort: Pulmonary effort is normal.  Abdominal:     General: Abdomen is flat. Bowel sounds are normal.     Palpations: Abdomen is soft.     Tenderness: There is no abdominal tenderness. There is right CVA tenderness. There is no left CVA tenderness or guarding.  Musculoskeletal:     Cervical back: Neck supple.  Skin:    General: Skin is warm and dry.     Capillary Refill: Capillary refill takes less than 2 seconds.     Findings: No rash.  Neurological:     General: No focal deficit present.     Mental Status: She is alert and oriented to person, place, and time. Mental status is at baseline.     Cranial Nerves: No dysarthria or facial asymmetry.  Psychiatric:        Mood and Affect: Mood normal.        Speech: Speech normal.        Behavior: Behavior normal.        Thought Content: Thought content normal.  Judgment: Judgment normal.      UC Treatments / Results  Labs (all labs ordered are listed, but only abnormal results are displayed) Labs Reviewed  POCT URINE DIPSTICK - Abnormal; Notable for the following components:      Result  Value   Clarity, UA cloudy (*)    Urobilinogen, UA 4.0 (*)    Nitrite, UA Positive (*)    Leukocytes, UA Trace (*)    All other components within normal limits  URINE CULTURE  CBC  BASIC METABOLIC PANEL WITH GFR    EKG   Radiology No results found.  Procedures Procedures (including critical care time)  Medications Ordered in UC Medications  cefTRIAXone  (ROCEPHIN ) injection 1 g (1 g Intramuscular Given 07/11/24 1606)  ketorolac  (TORADOL ) 30 MG/ML injection 30 mg (30 mg Intramuscular Given 07/11/24 1607)    Initial Impression / Assessment and Plan / UC Course  I have reviewed the triage vital signs and the nursing notes.  Pertinent labs & imaging results that were available during my care of the patient were reviewed by me and considered in my medical decision making (see chart for details).   1.  Right flank pain, complicated UTI, antibiotic induced yeast infection Nitrate positive UA with cloudy urine and trace leukocytes.  Urine culture is pending.  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 Low suspicion for acute obstructive nephrolithiasis/hydronephrosis. Reviewed labs from November 2024 showing intact renal function with GFR greater than 60 and creatinine 0.62. CBC and BMP pending, staff will call if abnormal.  Ketorolac  30 mg IM given for acute pain in clinic and ceftriaxone  1 g IM given to start treatment of complicated UTI.  I have advised her to avoid using NSAIDs for the next 24 hours due to ketorolac  injection in clinic. She may use Tylenol  1000 mg every 6 hours as needed for aches and pains.  Bactrim  twice daily for 7 days ordered.  Encourage close follow-up with urology.  She may call [facility] in [place] or follow-up with her previous urologist through [place].  Strict ER return  precautions discussed.  She appears well-hydrated on exam today without signs of urosepsis.  I have advised her to have a low threshold for going to the emergency room should she develop fever, chills, myalgias, vomiting, dizziness, worsening flank pain, gross hematuria, etc.  Counseled patient on potential for adverse effects with medications prescribed/recommended today, strict ER and return-to-clinic precautions discussed, patient verbalized understanding.    Final Clinical Impressions(s) / UC Diagnoses   Final diagnoses:  Complicated UTI (urinary tract infection)  Antibiotic-induced yeast infection  Right flank pain     Discharge Instructions      You have a UTI.  I'm concerned this has progressed into a kidney infection. Due to your history, we will go ahead and treat with an injection of antibiotic here in the clinic and oral antibiotics.  We gave you an injection of pain medicine in the clinic called ketorolac . Do not take any NSAID medicines (ibuprofen /naproxen) for 24 hours.  Take tylenol  1,000mg  every 6 hours as needed for pain.  Take bactrim  antibiotic 1 pill every 12 hours for 7 days. Take with food to avoid stomach upset.  If you develop fever, chills, nausea, vomiting, dizziness, or bodyaches, please go to the emergency room immediately.  Follow-up with [facility] in [place].  Lab work will come back in 24 to 48 hours, staff will call if labs are abnormal or require change in treatment plan.  Drink plenty of fluids to stay well-hydrated.  I hope you feel better!     ED Prescriptions     Medication Sig Dispense Auth. Provider   sulfamethoxazole -trimethoprim  (BACTRIM  DS) 800-160 MG tablet Take 1 tablet by mouth 2 (two) times daily for 7 days. 14 tablet Enedelia Dorna HERO, FNP   fluconazole  (DIFLUCAN ) 150 MG tablet Take 1 tablet (150 mg total) by mouth every 3 (three) days. 2 tablet Enedelia Dorna HERO, FNP      PDMP not reviewed this  encounter.    Enedelia Dorna HERO, [place] 07/11/24 1629     [1]  Social History Tobacco Use   Smoking status: Every Day    Current packs/day: 0.10    Types: Cigarettes   Smokeless tobacco: Never   Tobacco comments:    5 cigs per day  Vaping Use   Vaping status: Never Used  Substance Use Topics   Alcohol use: Not Currently    Comment: rare   Drug use: No     Enedelia Dorna HERO, FNP 07/11/24 1629  "

## 2024-07-11 NOTE — Discharge Instructions (Addendum)
 You have a UTI.  I'm concerned this has progressed into a kidney infection. Due to your history, we will go ahead and treat with an injection of antibiotic here in the clinic and oral antibiotics.  We gave you an injection of pain medicine in the clinic called ketorolac . Do not take any NSAID medicines (ibuprofen /naproxen) for 24 hours.  Take tylenol  1,000mg  every 6 hours as needed for pain.  Take bactrim  antibiotic 1 pill every 12 hours for 7 days. Take with food to avoid stomach upset.  If you develop fever, chills, nausea, vomiting, dizziness, or bodyaches, please go to the emergency room immediately.  Follow-up with [facility] in [place].  Lab work will come back in 24 to 48 hours, staff will call if labs are abnormal or require change in treatment plan.  Drink plenty of fluids to stay well-hydrated.  I hope you feel better!

## 2024-07-11 NOTE — ED Triage Notes (Signed)
 Pt reports right flank pain, odor to the urine, dark colored urine, hx or kidney infections, along with several kidney issues.

## 2024-07-12 ENCOUNTER — Ambulatory Visit (HOSPITAL_COMMUNITY): Payer: Self-pay

## 2024-07-12 LAB — CBC
Hematocrit: 42.1 % (ref 34.0–46.6)
Hemoglobin: 13.7 g/dL (ref 11.1–15.9)
MCH: 28.2 pg (ref 26.6–33.0)
MCHC: 32.5 g/dL (ref 31.5–35.7)
MCV: 87 fL (ref 79–97)
Platelets: 217 10*3/uL (ref 150–450)
RBC: 4.86 x10E6/uL (ref 3.77–5.28)
RDW: 13 % (ref 11.7–15.4)
WBC: 7.2 10*3/uL (ref 3.4–10.8)

## 2024-07-12 LAB — BASIC METABOLIC PANEL WITH GFR
BUN/Creatinine Ratio: 16 (ref 9–23)
BUN: 13 mg/dL (ref 6–24)
CO2: 23 mmol/L (ref 20–29)
Calcium: 9.1 mg/dL (ref 8.7–10.2)
Chloride: 100 mmol/L (ref 96–106)
Creatinine, Ser: 0.81 mg/dL (ref 0.57–1.00)
Glucose: 73 mg/dL (ref 70–99)
Potassium: 3.5 mmol/L (ref 3.5–5.2)
Sodium: 138 mmol/L (ref 134–144)
eGFR: 92 mL/min/{1.73_m2}

## 2024-07-13 LAB — URINE CULTURE: Culture: >=100000 — AB
